# Patient Record
Sex: Male | Born: 1965 | Race: Black or African American | Hispanic: No | Marital: Married | Smoking: Never smoker
Health system: Southern US, Community
[De-identification: ages and names within clinical notes are randomized; demographics above are authoritative.]

## PROBLEM LIST (undated history)

## (undated) DIAGNOSIS — M109 Gout, unspecified: Secondary | ICD-10-CM

## (undated) DIAGNOSIS — M1990 Unspecified osteoarthritis, unspecified site: Secondary | ICD-10-CM

## (undated) DIAGNOSIS — E119 Type 2 diabetes mellitus without complications: Secondary | ICD-10-CM

## (undated) DIAGNOSIS — G4730 Sleep apnea, unspecified: Secondary | ICD-10-CM

## (undated) DIAGNOSIS — C61 Malignant neoplasm of prostate: Secondary | ICD-10-CM

## (undated) DIAGNOSIS — K219 Gastro-esophageal reflux disease without esophagitis: Secondary | ICD-10-CM

## (undated) DIAGNOSIS — Z973 Presence of spectacles and contact lenses: Secondary | ICD-10-CM

## (undated) DIAGNOSIS — J302 Other seasonal allergic rhinitis: Secondary | ICD-10-CM

## (undated) DIAGNOSIS — E7800 Pure hypercholesterolemia, unspecified: Secondary | ICD-10-CM

## (undated) HISTORY — DX: Pure hypercholesterolemia, unspecified: E78.00

## (undated) HISTORY — DX: Gout, unspecified: M10.9

## (undated) HISTORY — DX: Gastro-esophageal reflux disease without esophagitis: K21.9

## (undated) HISTORY — PX: PROSTATE BIOPSY: SHX241

## (undated) HISTORY — PX: CARPAL TUNNEL RELEASE: SHX101

## (undated) HISTORY — PX: COLONOSCOPY: SHX174

## (undated) HISTORY — DX: Sleep apnea, unspecified: G47.30

## (undated) HISTORY — DX: Unspecified osteoarthritis, unspecified site: M19.90

## (undated) HISTORY — PX: HIATAL HERNIA REPAIR: SHX195

---

## 2018-08-25 DIAGNOSIS — R059 Cough, unspecified: Secondary | ICD-10-CM | POA: Insufficient documentation

## 2018-08-25 DIAGNOSIS — J189 Pneumonia, unspecified organism: Secondary | ICD-10-CM | POA: Insufficient documentation

## 2018-08-25 DIAGNOSIS — R0981 Nasal congestion: Secondary | ICD-10-CM | POA: Insufficient documentation

## 2019-05-19 ENCOUNTER — Encounter: Payer: Self-pay | Admitting: Urology

## 2019-05-19 ENCOUNTER — Other Ambulatory Visit: Payer: Self-pay

## 2019-05-19 ENCOUNTER — Ambulatory Visit (INDEPENDENT_AMBULATORY_CARE_PROVIDER_SITE_OTHER): Payer: BC Managed Care – PPO | Admitting: Urology

## 2019-05-19 VITALS — BP 136/82 | HR 57 | Temp 97.9°F | Ht 74.0 in | Wt 280.0 lb

## 2019-05-19 DIAGNOSIS — R972 Elevated prostate specific antigen [PSA]: Secondary | ICD-10-CM | POA: Diagnosis not present

## 2019-05-19 DIAGNOSIS — N521 Erectile dysfunction due to diseases classified elsewhere: Secondary | ICD-10-CM | POA: Diagnosis not present

## 2019-05-19 LAB — PSA: PSA: 7.9 ng/mL — ABNORMAL HIGH (ref <=4.0)

## 2019-05-19 MED ORDER — SILDENAFIL CITRATE 100 MG PO TABS: 100.0000 mg | ORAL_TABLET | ORAL | 1 refills | Status: DC | PRN

## 2019-05-19 MED ORDER — SILDENAFIL CITRATE 100 MG PO TABS: 100.0000 mg | ORAL_TABLET | ORAL | 11 refills | Status: DC | PRN

## 2019-05-19 NOTE — Progress Notes (Signed)
H&P  Chief Complaint: Elevated PSA  History of Present Illness:   1.12.2021: Referred today by Dr Hardie Shackleton having had an elevated PSA -- on 11.2.2021 it measured as 8.2. He denies any significant urinary sx's or complaints, any family hx of prostate issues, any personal hx of prostate infection, and he states that out of all of his regular PSA checks this recent lab was the only abnormal result.   He also notes some recently worsening erectile dysfunction for which he has not sought any treatment. He states that he is having no issues achieving erection but has been having worsening issues maintaining one when needed. He was previously prescribed Viagra but was hesitant to have this filled due to his perception of this drug being dangerous for one's heart.  IPSS Questionnaire (AUA-7): Over the past month.   1)  How often have you had a sensation of not emptying your bladder completely after you finish urinating?  1 - Less than 1 time in 5  2)  How often have you had to urinate again less than two hours after you finished urinating? 2 - Less than half the time  3)  How often have you found you stopped and started again several times when you urinated?  0 - Not at all  4) How difficult have you found it to postpone urination?  0 - Not at all  5) How often have you had a weak urinary stream?  0 - Not at all  6) How often have you had to push or strain to begin urination?  0 - Not at all  7) How many times did you most typically get up to urinate from the time you went to bed until the time you got up in the morning?  2 - 2 times  Total score:  0-7 mildly symptomatic   8-19 moderately symptomatic   20-35 severely symptomatic    Past Medical History:  Diagnosis Date  . Acid reflux   . Arthritis   . Gout   . High cholesterol   . Sleep apnea     Home Medications:  Allergies as of 05/19/2019      Reactions   Mixed Ragweed    Watery eyes, sneezing      Medication List       Accurate  as of May 19, 2019  9:48 AM. If you have any questions, ask your nurse or doctor.        colchicine 0.6 MG tablet Take 0.6 mg by mouth daily.   ezetimibe 10 MG tablet Commonly known as: ZETIA Take 10 mg by mouth daily.   famotidine 20 MG tablet Commonly known as: PEPCID   fexofenadine 180 MG tablet Commonly known as: ALLEGRA Take 180 mg by mouth daily.   gabapentin 300 MG capsule Commonly known as: NEURONTIN Take 300 mg by mouth 3 (three) times daily as needed.   ibuprofen 800 MG tablet Commonly known as: ADVIL Take 800 mg by mouth 3 (three) times daily as needed.   omeprazole 40 MG capsule Commonly known as: PRILOSEC Take 40 mg by mouth daily.   Vitamin D3 50 MCG (2000 UT) Tabs Take 50 mcg by mouth.       Allergies:  Allergies  Allergen Reactions  . Mixed Ragweed     Watery eyes, sneezing    No family history on file.  Social History:  reports that he has never smoked. He has never used smokeless tobacco. He reports current alcohol  use. No history on file for drug.  ROS: A complete review of systems was performed.  All systems are negative except for pertinent findings as noted.  Physical Exam:  Vital signs in last 24 hours: BP 136/82   Pulse (!) 57   Temp 97.9 F (36.6 C)   Ht 6\' 2"  (1.88 m)   Wt 280 lb (127 kg)   BMI 35.95 kg/m   Constitutional:  Alert and oriented, No acute distress Cardiovascular: Regular rate  Respiratory: Normal respiratory effort GI: Abdomen is soft, nontender, nondistended, no abdominal masses. No CVAT.  Genitourinary: Normal male phallus, testes are descended bilaterally and non-tender and without masses, scrotum is normal in appearance without lesions or masses, perineum is normal on inspection. Lymphatic: No lymphadenopathy. Prostate feels around 30 grams in size, no nodules or firmness, DRE normal.  Neurologic: Grossly intact, no focal deficits Psychiatric: Normal mood and affect  Laboratory Data:   Dr Romilda Garret'  records reviewed  U/A reviewed     Renal Function: No results for input(s): CREATININE in the last 168 hours. CrCl cannot be calculated (No successful lab value found.).  Radiologic Imaging: No results found.  Impression/Assessment:  Start on trial of sildenafil -- he was assured that there is no reason to be concerned for his heart health just from taking this medication.   We will recheck his PSA today, it is possible that this elevation was due to some benign irritation process. This is more likely given that he has had no other abnormal measurements from any of his annual checks. Pending results, will plan appropriate follow-up. Prostate exam normal.   Plan:  1. Recheck PSA today -- will forward results.  2. Pending results, will plan appropriate follow-up. If still elevated, will consult on scheduling [place] bx. If normal, I will still have him return in a few mo's for recheck.   3. Start on sildenafil to use PRN. SE's discussed

## 2019-05-22 ENCOUNTER — Telehealth: Payer: Self-pay

## 2019-05-22 ENCOUNTER — Other Ambulatory Visit: Payer: Self-pay | Admitting: Urology

## 2019-05-22 DIAGNOSIS — R972 Elevated prostate specific antigen [PSA]: Secondary | ICD-10-CM

## 2019-05-22 MED ORDER — LEVOFLOXACIN 750 MG PO TABS: 750.0000 mg | ORAL_TABLET | Freq: Every day | ORAL | 0 refills | Status: AC

## 2019-05-22 NOTE — Telephone Encounter (Signed)
Patient notified of PSA value and Dr. Diona Fanti recommending prostate biopsy. Attempted to schedule with pt. Pt wishes to call office back to schedule with he calls his work. Informed pt importance of scheduling biopsy. Pt voiced understanding and he would call back.

## 2019-05-22 NOTE — Telephone Encounter (Signed)
-----   Message from Franchot Gallo, MD sent at 05/22/2019 12:11 PM EST ----- Notify pt psa still high--7.9 will need TRUS/Bx--orders put in

## 2019-05-26 ENCOUNTER — Other Ambulatory Visit: Payer: Self-pay

## 2019-05-26 DIAGNOSIS — R972 Elevated prostate specific antigen [PSA]: Secondary | ICD-10-CM

## 2019-06-30 ENCOUNTER — Other Ambulatory Visit: Payer: Self-pay

## 2019-06-30 ENCOUNTER — Ambulatory Visit (HOSPITAL_COMMUNITY)
Admission: RE | Admit: 2019-06-30 | Discharge: 2019-06-30 | Disposition: A | Discharge status: Home / Self Care | Payer: BC Managed Care – PPO | Source: Ambulatory Visit | Attending: Urology | Admitting: Urology

## 2019-06-30 ENCOUNTER — Other Ambulatory Visit: Payer: Self-pay | Admitting: Urology

## 2019-06-30 DIAGNOSIS — C61 Malignant neoplasm of prostate: Secondary | ICD-10-CM | POA: Insufficient documentation

## 2019-06-30 DIAGNOSIS — R972 Elevated prostate specific antigen [PSA]: Secondary | ICD-10-CM

## 2019-06-30 MED ORDER — LIDOCAINE HCL (PF) 2 % IJ SOLN
INTRAMUSCULAR | Status: AC
  Filled 2019-06-30: qty 10

## 2019-06-30 MED ORDER — GENTAMICIN SULFATE 40 MG/ML IJ SOLN: 160.0000 mg | Freq: Once | INTRAMUSCULAR | Status: AC

## 2019-06-30 MED ORDER — GENTAMICIN SULFATE 40 MG/ML IJ SOLN
INTRAMUSCULAR | Status: AC
  Administered 2019-06-30: 160 mg via INTRAMUSCULAR
  Filled 2019-06-30: qty 4

## 2019-06-30 NOTE — Procedures (Signed)
This man presents for TRUS/Bx.  Reason for procedure: Elevated PSA  Prostate exam: 30 gm, no nodules.  Appropriate timeout was performed. The patient was placed in the left lateral decubitus position.  Transrectal ultrasound probe was passed without difficulty. Prostate was scanned in transverse and sagittal dimensions.  Volume was obtained. Ultrasound findings: No hypoechoic regions. SVs nml. Volume 43 ml. Using a spinal needle, 10 mL of 2% plain lidocaine was utilized to infiltrate the lateral aspect of the seminal vesicles bilaterally. Following this, 12 cores were taken using the biopsy gun, and the following distribution: Right base lateral, right base medial, right mid lateral, right mid medial, right apex lateral, right apex medial, left base lateral, left base medial, left mid lateral, left mid medial, left apex lateral, left apex medial.  These were all sent for pathologic review separately in formalin.  At this point, the probe was removed.  Pressure was placed on the anal area for approximately 1 minute.  After establishing no significant bleeding, the procedure was then terminated.  The patient tolerated the procedure well. He was given post-boipsy instructions. We will call with pathology results and followup.

## 2019-07-10 ENCOUNTER — Telehealth: Payer: Self-pay

## 2019-07-10 NOTE — Telephone Encounter (Signed)
Schedule virtual visit with pt at 8:15 on April 6th. . Pt notified.

## 2019-07-10 NOTE — Telephone Encounter (Signed)
-----   Message from Franchot Gallo, MD sent at 07/09/2019 12:41 PM EST ----- Regarding: RE: appt yes ----- Message ----- From: Dorisann Frames, RN Sent: 07/08/2019   9:47 AM EST To: Franchot Gallo, MD Subject: RE: appt                                       I called pt to schedule. Pt works 3rd shift and doesn't  have Tuesday evenings off. On April 6th would you be willing to do a virtual cancer talk that am in your biopsy slot time? ----- Message ----- From: Franchot Gallo, MD Sent: 07/07/2019   4:59 PM EST To: Dorisann Frames, RN Subject: appt                                           Please have bx results sent to pt and sched PCA consult ----- Message ----- From: Dorisann Frames, RN Sent: 07/01/2019  12:07 PM EST To: Franchot Gallo, MD  Biopsy path report

## 2019-07-28 ENCOUNTER — Other Ambulatory Visit: Payer: Self-pay | Admitting: Urology

## 2019-07-28 DIAGNOSIS — R972 Elevated prostate specific antigen [PSA]: Secondary | ICD-10-CM

## 2019-07-28 MED ORDER — LEVOFLOXACIN 750 MG PO TABS: 750.0000 mg | ORAL_TABLET | Freq: Every day | ORAL | 0 refills | Status: AC

## 2019-07-29 ENCOUNTER — Other Ambulatory Visit: Payer: Self-pay

## 2019-08-11 ENCOUNTER — Other Ambulatory Visit: Payer: Self-pay

## 2019-08-11 ENCOUNTER — Telehealth (INDEPENDENT_AMBULATORY_CARE_PROVIDER_SITE_OTHER): Payer: BC Managed Care – PPO | Admitting: Urology

## 2019-08-11 DIAGNOSIS — C61 Malignant neoplasm of prostate: Secondary | ICD-10-CM

## 2019-08-11 DIAGNOSIS — Z7189 Other specified counseling: Secondary | ICD-10-CM | POA: Diagnosis not present

## 2019-08-11 DIAGNOSIS — Z712 Person consulting for explanation of examination or test findings: Secondary | ICD-10-CM

## 2019-08-11 NOTE — Progress Notes (Signed)
This man underwent ultrasound and biopsy of his prostate on 2.23.2021.  At that time, PSA was 7.9.  Prostate volume 43 mL.  PSAD 0.18.  4/12 cores revealed adenocarcinoma: 3 cores revealed GS 3+3 pattern-left base lateral, left base medial and left mid medial with 90%, 20% and 3% of core involved with cancer. 1 core revealed GS 3+4 pattern,.1 was located in the left apex lateral zone, 60% of core involved with cancer.  IPSS 5  Kattan nomogram predictions: Organ confined disease--67% Lymph node/seminal vesicle involvement--3% each Progression free probability following radical prostatectomy at 5/10 years--84/74% respectively  He presents for virtual/telehealth visit today.    The patient was counseled about the natural history of prostate cancer and the standard treatment options that are available for prostate cancer. It was explained to him how his age and life expectancy, clinical stage, Gleason score, and PSA affect his prognosis, the decision to proceed with additional staging studies, as well as how that information influences recommended treatment strategies. We discussed the roles for active surveillance, radiation therapy, surgical therapy, androgen deprivation, as well as ablative therapy options for the treatment of prostate cancer as appropriate to his individual cancer situation. We discussed the risks and benefits of these options with regard to their impact on cancer control and also in terms of potential adverse events, complications, and impact on quality of life particularly related to urinary, bowel, and sexual function. The patient was encouraged to ask questions throughout the discussion today and all questions were answered to his stated satisfaction. In addition, the patient was provided with and/or directed to appropriate resources and literature for further education about prostate cancer and treatment options  The patient seems to prefer brachytherapy over radical  prostatectomy.  I think he is a good candidate with having a normal size gland, minimal urinary symptomatology and relatively low risk prostate cancer.  I will set up an appointment to have the patient see Dr. Tyler Pita at the regional [facility] for consultation.  27 minutes spent in direct communication over the phone during this consultation.

## 2019-08-25 ENCOUNTER — Telehealth: Payer: BC Managed Care – PPO | Admitting: Urology

## 2019-08-31 ENCOUNTER — Encounter: Payer: Self-pay | Admitting: Radiation Oncology

## 2019-08-31 NOTE — Progress Notes (Signed)
GU Location of Tumor / Histology: prostatic adenocarcinoma  If Prostate Cancer, Gleason Score is (3 + 4) and PSA is (7.9) on 05-22-2019. Prostate volume: 43 mL.  Marc Kim was referred by Dr. Hardie Shackleton to Dr. Diona Fanti after discovering an elevated PSA of 8.2 on 03/29/2019.  Biopsies of prostate (if applicable) revealed:    Past/Anticipated interventions by urology, if any: prostate biopsy, referral to Dr. Tammi Klippel to discuss brachtherapy  Past/Anticipated interventions by medical oncology, if any: no  Weight changes, if any: no  Bowel/Bladder complaints, if any: IPSS 1. SHIM 20. Prescribed sildenafil. Nocturia x 1. Denies dysuria or hematuria. Denies blood in semen. Denies urinary leakage or incontinence. Denies any bowel complaints.    Nausea/Vomiting, if any: no  Pain issues, if any:  denies  SAFETY ISSUES:  Prior radiation? denies  Pacemaker/ICD? denies  Possible current pregnancy? no, male patient  Is the patient on methotrexate? denies  Current Complaints / other details:  54 year old male. Married with 1 child.

## 2019-09-01 ENCOUNTER — Ambulatory Visit
Admission: RE | Admit: 2019-09-01 | Discharge: 2019-09-01 | Disposition: A | Discharge status: Home / Self Care | Payer: BC Managed Care – PPO | Source: Ambulatory Visit | Attending: Radiation Oncology | Admitting: Radiation Oncology

## 2019-09-01 ENCOUNTER — Encounter: Payer: Self-pay | Admitting: Radiation Oncology

## 2019-09-01 ENCOUNTER — Other Ambulatory Visit: Payer: Self-pay

## 2019-09-01 VITALS — Ht 75.0 in | Wt 285.0 lb

## 2019-09-01 DIAGNOSIS — C61 Malignant neoplasm of prostate: Secondary | ICD-10-CM

## 2019-09-01 HISTORY — DX: Malignant neoplasm of prostate: C61

## 2019-09-01 NOTE — Progress Notes (Signed)
Radiation Oncology         [phone] ________________________________  Initial Outpatient Consultation - Conducted via Telephone due to current COVID-19 concerns for limiting patient exposure  Name: Marc Kim MRN: [id number]  Date: 09/01/2019  DOB: May 06, 1966  [id number], Marc Dienes, MD  Franchot Gallo, MD   REFERRING PHYSICIAN: Franchot Gallo, MD  DIAGNOSIS: 54 y.o. gentleman with Stage T1c adenocarcinoma of the prostate with Gleason score of 3+4, and PSA of 7.9.    ICD-10-CM   1. Malignant neoplasm of prostate ([place])  C61     HISTORY OF PRESENT ILLNESS: Marc Kim is a 54 y.o. male with a diagnosis of prostate cancer. He was noted to have an elevated PSA of 8.2 in 03/2019 by his primary care physician, Dr. Hampton Abbot.  Accordingly, he was referred for evaluation in urology by Dr. Diona Fanti on 05/19/2019,  digital rectal examination was performed at that time revealing no nodules.  Repeat PSA performed that day remained elevated at 7.9.  The patient proceeded to transrectal ultrasound with 12 biopsies of the prostate on 06/30/2019.  The prostate volume measured 43 cc.  Out of 12 core biopsies, 4 were positive, all on the left.  The maximum Gleason score was 3+4, and this was seen in the left apex lateral (with PNI). Additionally, Gleason 3+3 was seen in the left base lateral, left base (two small foci), and left mid (small focus).  The patient reviewed the biopsy results with his urologist and he has kindly been referred today for discussion of potential radiation treatment options.   PREVIOUS RADIATION THERAPY: No  PAST MEDICAL HISTORY:  Past Medical History:  Diagnosis Date  . Acid reflux   . Arthritis   . Gout   . High cholesterol   . Prostate cancer ([place])   . Sleep apnea       PAST SURGICAL HISTORY: Past Surgical History:  Procedure Laterality Date  . HIATAL HERNIA REPAIR  2002?  . PROSTATE BIOPSY      FAMILY HISTORY:  Family History  Problem Relation Age of  Onset  . Breast cancer Neg Hx   . Prostate cancer Neg Hx   . Colon cancer Neg Hx   . 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 Social History   Socioeconomic History  . Marital status: Married    Spouse name: Missy  . Number of children: 1  . Years of education: Not on file  . Highest education level: Not on file  Occupational History  . Occupation: truck Geophysicist/field seismologist    Comment: Full time   Tobacco Use  . Smoking status: Never Smoker  . Smokeless tobacco: Never Used  Substance and Sexual Activity  . Alcohol use: Yes    Comment: 2 per week  . Drug use: Never  . Sexual activity: Yes  Other Topics Concern  . Not on file  Social History Narrative  . Not on file   Social Determinants of Health   Financial Resource Strain:   . Difficulty of Paying Living Expenses:   Food Insecurity:   . Worried About Charity fundraiser in the Last Year:   . Arboriculturist in the Last Year:   Transportation Needs:   . Film/video editor (Medical):   Marland Kitchen Lack of Transportation (Non-Medical):   Physical Activity:   . Days of Exercise per Week:   . Minutes of Exercise  per Session:   Stress:   . Feeling of Stress :   Social Connections:   . Frequency of Communication with Friends and Family:   . Frequency of Social Gatherings with Friends and Family:   . Attends Religious Services:   . Active Member of Clubs or Organizations:   . Attends Archivist Meetings:   Marland Kitchen Marital Status:   Intimate Partner Violence:   . Fear of Current or Ex-Partner:   . Emotionally Abused:   Marland Kitchen Physically Abused:   . Sexually Abused:     ALLERGIES: Mixed ragweed  MEDICATIONS:  Current Outpatient Medications  Medication Sig Dispense Refill  . Cholecalciferol (VITAMIN D3) 50 MCG (2000 UT) TABS Take 50 mcg by mouth.    . fexofenadine (ALLEGRA) 180 MG tablet Take 180 mg by mouth daily.    Marland Kitchen gabapentin (NEURONTIN) 300  MG capsule Take 300 mg by mouth 3 (three) times daily as needed.    Marland Kitchen ibuprofen (ADVIL) 800 MG tablet Take 800 mg by mouth 3 (three) times daily as needed.    Marland Kitchen omeprazole (PRILOSEC) 40 MG capsule Take 40 mg by mouth daily.    . TRULICITY 1.5 0000000 SOPN Inject 1.5 mg into the skin once a week.    . colchicine 0.6 MG tablet Take 0.6 mg by mouth daily.    . sildenafil (VIAGRA) 100 MG tablet Take 1 tablet (100 mg total) by mouth as needed for erectile dysfunction. (Patient not taking: Reported on 09/01/2019) 20 tablet 11   No current facility-administered medications for this encounter.    REVIEW OF SYSTEMS:  On review of systems, the patient reports that he is doing well overall. He denies any chest pain, shortness of breath, cough, fevers, chills, night sweats, unintended weight changes. He denies any bowel disturbances, and denies abdominal pain, nausea or vomiting. He denies any new musculoskeletal or joint aches or pains. His IPSS was 1, indicating minimal urinary symptoms. His SHIM was 20, indicating he may have some mild erectile dysfunction. A complete review of systems is obtained and is otherwise negative.    PHYSICAL EXAM:  Wt Readings from Last 3 Encounters:  09/01/19 285 lb (129.3 kg)  05/19/19 280 lb (127 kg)   Temp Readings from Last 3 Encounters:  06/30/19 97.8 F (36.6 C) (Oral)  05/19/19 97.9 F (36.6 C)   BP Readings from Last 3 Encounters:  06/30/19 140/88  05/19/19 136/82   Pulse Readings from Last 3 Encounters:  06/30/19 64  05/19/19 (!) 57   Pain Assessment Pain Score: 0-No pain/10  Physical exam not performed in light of telephone consult visit format.   KPS = 100  100 - Normal; no complaints; no evidence of disease. 90   - Able to carry on normal activity; minor signs or symptoms of disease. 80   - Normal activity with effort; some signs or symptoms of disease. 56   - Cares for self; unable to carry on normal activity or to do active work. 60   -  Requires occasional assistance, but is able to care for most of his personal needs. 50   - Requires considerable assistance and frequent medical care. 28   - Disabled; requires special care and assistance. 45   - Severely disabled; hospital admission is indicated although death not imminent. 6   - Very sick; hospital admission necessary; active supportive treatment necessary. 10   - Moribund; fatal processes progressing rapidly. 0     - Dead  Karnofsky DA,  Abelmann WH, Craver LS and Burchenal JH [phone]) The use of the nitrogen mustards in the palliative treatment of carcinoma: with particular reference to bronchogenic carcinoma Cancer 1 634-56  LABORATORY DATA:  No results found for: WBC, HGB, HCT, MCV, PLT No results found for: NA, K, CL, CO2 No results found for: ALT, AST, GGT, ALKPHOS, BILITOT   RADIOGRAPHY: No results found.    IMPRESSION/PLAN: This visit was conducted via Telephone to spare the patient unnecessary potential exposure in the healthcare setting during the current COVID-19 pandemic. 1. 54 y.o. gentleman with Stage T1c adenocarcinoma of the prostate with Gleason Score of 3+4, and PSA of 7.9. We discussed the patient's workup and outlined the nature of prostate cancer in this setting. The patient's T stage, Gleason's score, and PSA put him into the favorable intermediate risk group. Accordingly, he is eligible for a variety of potential treatment options including brachytherapy, 5.5 weeks of external radiation, or prostatectomy. We discussed the available radiation techniques, and focused on the details and logistics of delivery. We discussed and outlined the risks, benefits, short and long-term effects associated with radiotherapy and compared and contrasted these with prostatectomy. We discussed the role of SpaceOAR in reducing the rectal toxicity associated with radiotherapy. The patient is adamantly not interested in prostatectomy.  At the end of the conversation, the patient  is interested in moving forward with brachytherapy and use of SpaceOAR to reduce rectal toxicity from radiotherapy.  We will share our discussion with Dr. Diona Fanti and move forward with scheduling his [facility] planning appointment in the near future.  The patient will be contacted by Marc Kim in our office who will be working closely with him to coordinate OR scheduling and pre and post procedure appointments.  We will contact the pharmaceutical rep to ensure that [place] is available at the time of procedure.  He will have a prostate MRI following his post-seed CT SIM to confirm appropriate distribution of the [place].   Given current concerns for patient exposure during the COVID-19 pandemic, this encounter was conducted via telephone. The patient was notified in advance and was offered a MyChart meeting to allow for face to face communication but unfortunately reported that he did not have the appropriate resources/technology to support such a visit and instead preferred to proceed with telephone consult. The patient has given verbal consent for this type of encounter. The time spent during this encounter was 60 minutes. The attendants for this meeting include Tyler Pita MD, Ashlyn Bruning PA-C, [place], and patient, Marc Kim. During the encounter, Tyler Pita MD, Ashlyn Bruning PA-C, and scribe, Wilburn Mylar were located at [place].  Patient, Marc Kim was located at home.    Nicholos Johns, PA-C    Tyler Pita, MD  [facility] Direct Dial: [phone]  Fax: [phone] [email or web address]  Skype  [email or web address]  This document serves as a record of services personally performed by Tyler Pita, MD and Freeman Caldron, PA-C. It was created on their behalf by Wilburn Mylar, a trained medical scribe. The creation of this record is based on the scribe's personal  observations and the provider's statements to them. This document has been checked and approved by the attending provider.

## 2019-09-01 NOTE — Progress Notes (Signed)
See progress note under physician encounter. 

## 2019-09-08 ENCOUNTER — Encounter: Payer: Self-pay | Admitting: Medical Oncology

## 2019-09-08 NOTE — Progress Notes (Signed)
Spoke with patient to introduce myself as the prostate nurse navigator and discuss my role. I was unable to meet him 4/27, when he consulted with Dr. Tammi Klippel. He states the consult went well and he has chosen brachytherapy. He asked if I called him yesterday about an appointment. 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 I informed him that it possibly could be Enid Derry, calling to set up CT simulation. He asked about taking a few weeks off for the procedure. I discussed getting FMLA forms that our office can complete. I gave him my contact information and asked him to call me with questions or concerns. I will ask Enid Derry to contact him, if she was trying to reach him.

## 2019-09-15 ENCOUNTER — Telehealth: Payer: Self-pay | Admitting: *Deleted

## 2019-09-15 ENCOUNTER — Telehealth: Payer: Self-pay

## 2019-09-15 ENCOUNTER — Other Ambulatory Visit: Payer: Self-pay | Admitting: Urology

## 2019-09-15 NOTE — Telephone Encounter (Signed)
Left message with pre op appt date on pt voicemail.

## 2019-09-15 NOTE — Telephone Encounter (Signed)
Called patient to inform of implant date, spoke with patient and he is aware of this implant date

## 2019-09-15 NOTE — Telephone Encounter (Signed)
-----   Message from Franchot Gallo, MD sent at 09/15/2019 12:38 PM EDT ----- Regarding: seeds patient scheduled for brachytherapy in [place] on July 8.  He needs preop appointment here less than a month beforehand.

## 2019-09-22 ENCOUNTER — Telehealth: Payer: Self-pay | Admitting: *Deleted

## 2019-09-22 NOTE — Telephone Encounter (Signed)
CALLED PATIENT TO ALTER 10/02/19 APPTS. DUE TO ASHLYN BRUNING BEING OFF, RESCHEDULED FOR 10-09-19, PATIENT AGREED TO NEW DATE AND TIME

## 2019-10-02 ENCOUNTER — Encounter (HOSPITAL_COMMUNITY): Payer: BC Managed Care – PPO

## 2019-10-02 ENCOUNTER — Ambulatory Visit: Payer: BC Managed Care – PPO | Admitting: Radiation Oncology

## 2019-10-02 ENCOUNTER — Ambulatory Visit: Payer: Self-pay | Admitting: Urology

## 2019-10-08 ENCOUNTER — Telehealth: Payer: Self-pay

## 2019-10-08 NOTE — Telephone Encounter (Signed)
Appointment reminder for 10/09/19 patient verbalized he understood its an in person appointment.

## 2019-10-09 ENCOUNTER — Ambulatory Visit (HOSPITAL_COMMUNITY)
Admission: RE | Admit: 2019-10-09 | Discharge: 2019-10-09 | Disposition: A | Discharge status: Home / Self Care | Payer: BC Managed Care – PPO | Source: Ambulatory Visit | Attending: Urology | Admitting: Urology

## 2019-10-09 ENCOUNTER — Ambulatory Visit
Admission: RE | Admit: 2019-10-09 | Discharge: 2019-10-09 | Disposition: A | Discharge status: Home / Self Care | Payer: BC Managed Care – PPO | Source: Ambulatory Visit | Attending: Radiation Oncology | Admitting: Radiation Oncology

## 2019-10-09 ENCOUNTER — Encounter: Payer: Self-pay | Admitting: Medical Oncology

## 2019-10-09 ENCOUNTER — Other Ambulatory Visit: Payer: Self-pay | Admitting: Urology

## 2019-10-09 ENCOUNTER — Other Ambulatory Visit: Payer: Self-pay

## 2019-10-09 ENCOUNTER — Encounter (HOSPITAL_COMMUNITY)
Admission: RE | Admit: 2019-10-09 | Discharge: 2019-10-09 | Disposition: A | Discharge status: Home / Self Care | Payer: BC Managed Care – PPO | Source: Ambulatory Visit | Attending: Urology | Admitting: Urology

## 2019-10-09 ENCOUNTER — Encounter: Payer: Self-pay | Admitting: Radiation Oncology

## 2019-10-09 ENCOUNTER — Ambulatory Visit
Admission: RE | Admit: 2019-10-09 | Discharge: 2019-10-09 | Disposition: A | Discharge status: Home / Self Care | Payer: BC Managed Care – PPO | Source: Ambulatory Visit | Attending: Urology | Admitting: Urology

## 2019-10-09 DIAGNOSIS — Z01818 Encounter for other preprocedural examination: Secondary | ICD-10-CM

## 2019-10-09 DIAGNOSIS — C61 Malignant neoplasm of prostate: Secondary | ICD-10-CM

## 2019-10-12 ENCOUNTER — Encounter: Payer: Self-pay | Admitting: Radiation Oncology

## 2019-10-12 NOTE — Progress Notes (Signed)
Delivered FMLA paperwork to orange folder in front office of radiation oncology per protocol.

## 2019-10-13 NOTE — Progress Notes (Signed)
  Radiation Oncology         [phone] ________________________________  Name: Marc Kim MRN: [id number]  Date: 10/09/2019  DOB: 1966/04/22  SIMULATION AND TREATMENT PLANNING NOTE PUBIC ARCH STUDY  TX:[facility], Barry Dienes, MD  Franchot Gallo, MD  DIAGNOSIS: 54 y.o. gentleman with Stage T1c adenocarcinoma of the prostate with Gleason score of 3+4, and PSA of 7.9  Oncology History  Malignant neoplasm of prostate ([place])  07/03/2019 Cancer Staging   Staging form: Prostate, AJCC 8th Edition - Clinical stage from 07/03/2019: Stage IIB (cT1c, cN0, cM0, PSA: 7.9, Grade Group: 2) - Signed by Freeman Caldron, PA-C on 09/01/2019   09/01/2019 Initial Diagnosis   Malignant neoplasm of prostate ([place])       ICD-10-CM   1. Malignant neoplasm of prostate ([place])  C61     COMPLEX SIMULATION:  The patient presented today for evaluation for possible prostate seed implant. He was brought to the radiation planning suite and placed supine on the CT couch. A 3-dimensional image study set was obtained in upload to the planning computer. There, on each axial slice, I contoured the prostate gland. Then, using three-dimensional radiation planning tools I reconstructed the prostate in view of the structures from the transperineal needle pathway to assess for possible pubic arch interference. In doing so, I did not appreciate any pubic arch interference. Also, the patient's prostate volume was estimated based on the drawn structure. The volume was 43 cc.  Given the pubic arch appearance and prostate volume, patient remains a good candidate to proceed with prostate seed implant. Today, he freely provided informed written consent to proceed.    PLAN: The patient will undergo prostate seed implant.   ________________________________  Sheral Apley. Tammi Klippel, M.D.

## 2019-11-03 ENCOUNTER — Other Ambulatory Visit: Payer: Self-pay

## 2019-11-03 ENCOUNTER — Encounter: Payer: Self-pay | Admitting: Urology

## 2019-11-03 ENCOUNTER — Ambulatory Visit: Payer: BC Managed Care – PPO | Admitting: Urology

## 2019-11-03 ENCOUNTER — Ambulatory Visit (INDEPENDENT_AMBULATORY_CARE_PROVIDER_SITE_OTHER): Payer: BC Managed Care – PPO | Admitting: Urology

## 2019-11-03 VITALS — BP 122/79 | HR 64 | Temp 97.9°F | Ht 75.0 in | Wt 280.0 lb

## 2019-11-03 DIAGNOSIS — C61 Malignant neoplasm of prostate: Secondary | ICD-10-CM | POA: Diagnosis not present

## 2019-11-03 LAB — POCT URINALYSIS DIPSTICK
Bilirubin, UA: NEGATIVE
Blood, UA: NEGATIVE
Glucose, UA: NEGATIVE
Ketones, UA: NEGATIVE
Leukocytes, UA: NEGATIVE
Nitrite, UA: NEGATIVE
Protein, UA: NEGATIVE
Spec Grav, UA: >=1.03 — AB (ref 1.010–1.025)
Urobilinogen, UA: 0.2 E.U./dL
pH, UA: 5 (ref 5.0–8.0)

## 2019-11-03 NOTE — Progress Notes (Signed)
See progress note.

## 2019-11-03 NOTE — Progress Notes (Signed)
H&P  Chief Complaint: Prostate cancer, preop visit  History of Present Illness: 54 year old male presents for preoperative visit. He is scheduled for brachytherapy in the near future. His history is as follows:  This man underwent ultrasound and biopsy of his prostate on 2.23.2021.  At that time, PSA was 7.9.  Prostate volume 43 mL.  PSAD 0.18.  4/12 cores revealed adenocarcinoma: 3 cores revealed GS 3+3 pattern-left base lateral, left base medial and left mid medial with 90%, 20% and 3% of core involved with cancer. 1 core revealed GS 3+4 pattern,.1 was located in the left apex lateral zone, 60% of core involved with cancer.  IPSS 5  Past Medical History:  Diagnosis Date  . Acid reflux   . Arthritis   . Gout   . High cholesterol   . Prostate cancer ([place])   . Sleep apnea     Past Surgical History:  Procedure Laterality Date  . HIATAL HERNIA REPAIR  2002?  . PROSTATE BIOPSY      Home Medications:  Allergies as of 11/03/2019      Reactions   Mixed Ragweed    Watery eyes, sneezing      Medication List       Accurate as of November 03, 2019  8:58 AM. If you have any questions, ask your nurse or doctor.        colchicine 0.6 MG tablet Take 0.6 mg by mouth daily.   D3-50 1.25 MG (50000 UT) capsule Generic drug: Cholecalciferol Take 50,000 Units by mouth once a week. What changed: Another medication with the same name was removed. Continue taking this medication, and follow the directions you see here. Changed by: Jorja Loa, MD   fexofenadine 180 MG tablet Commonly known as: ALLEGRA Take 180 mg by mouth daily.   gabapentin 300 MG capsule Commonly known as: NEURONTIN Take 300 mg by mouth 3 (three) times daily as needed.   ibuprofen 800 MG tablet Commonly known as: ADVIL Take 800 mg by mouth 3 (three) times daily as needed.   omeprazole 40 MG capsule Commonly known as: PRILOSEC Take 40 mg by mouth daily.   sildenafil 100 MG tablet Commonly known as:  Viagra Take 1 tablet (100 mg total) by mouth as needed for erectile dysfunction.   triamcinolone cream 0.1 % Commonly known as: KENALOG SMARTSIG:1 Application Topical 2-3 Times Daily   Trulicity 1.5 RX/5.4MG Sopn Generic drug: Dulaglutide Inject 1.5 mg into the skin once a week.       Allergies:  Allergies  Allergen Reactions  . Mixed Ragweed     Watery eyes, sneezing    Family History  Problem Relation Age of Onset  . Breast cancer Neg Hx   . Prostate cancer Neg Hx   . Colon cancer Neg Hx   . Pancreatic cancer Neg Hx     Social History:  reports that he has never smoked. He has never used smokeless tobacco. He reports current alcohol use. He reports that he does not use drugs.  ROS: Urological Symptom Review  Patient is experiencing the following symptoms: Get up at night to urinate Review of Systems Gastrointestinal (upper)  : Negative for upper GI symptoms Gastrointestinal (lower) : Negative for lower GI symptoms Constitutional : Negative for symptoms Skin: Negative for skin symptoms Eyes: Negative for eye symptoms Ear/Nose/Throat : Negative for Ear/Nose/Throat symptoms Hematologic/Lymphatic: Negative for Hematologic/Lymphatic symptoms Cardiovascular : Negative for cardiovascular symptoms Respiratory : Negative for respiratory symptoms Endocrine: Negative for endocrine symptoms Musculoskeletal:  Negative for musculoskeletal symptoms Neurological: Negative for neurological symptoms Psychologic: Negative for psychiatric symptoms   Physical Exam:  Vital signs in last 24 hours: BP 122/79   Pulse 64   Temp 97.9 F (36.6 C)   Ht 6\' 3"  (1.905 m)   Wt 280 lb (127 kg)   BMI 35.00 kg/m  Constitutional:  Alert and oriented, No acute distress Cardiovascular: Regular rate  Respiratory: Normal respiratory effort Neurologic: Grossly intact, no focal deficits Psychiatric: Normal mood and affect  I have reviewed prior pt notes  I have reviewed notes  from referring/previous physicians  I have reviewed urinalysis results  I have independently reviewed prior imaging  I have reviewed prior PSA results  Impression/Assessment:  Relatively low risk prostate cancer  Plan:  He is scheduled for I-125 brachytherapy  I described brief risks and complications as well as postoperative course with the patient.

## 2019-11-04 ENCOUNTER — Telehealth: Payer: Self-pay

## 2019-11-04 NOTE — Telephone Encounter (Signed)
[employer] paperwork completed. Wife called- per wife requested paperwork be mailed to pt house for pt to sign and send in.  Paperwork mailed.

## 2019-11-05 ENCOUNTER — Encounter (HOSPITAL_BASED_OUTPATIENT_CLINIC_OR_DEPARTMENT_OTHER): Payer: Self-pay | Admitting: Urology

## 2019-11-05 ENCOUNTER — Other Ambulatory Visit: Payer: Self-pay

## 2019-11-05 NOTE — Progress Notes (Signed)
Spoke with: Marc Kim NPO:  No food after midnight/Clear liquids until 7:00 AM DOS Arrival time: 8:00 AM Lab needs dos----   N/A           Lab results------CBC, CMP, PT, PTT, EKG, CXR COVID test ------ 11/10/2019                   Medications to take morning of surgery:Fexofenadine, Omeprazole Pre op orders in epic: Yes Diabetic medication -----None Patient Special Instructions -----Bring CPAP machine, mask and tubing Pre-Op special Istructions -----Fleet enema day of surgery Ride home:Missy (wife) [phone]  Patient verbalized understanding of instructions that were given at this phone interview. Patient denies shortness of breath, chest pain, fever, cough a this phone interview.

## 2019-11-05 NOTE — Progress Notes (Signed)
NEW Covid Policy July 5638  Surgery Day:  Thursday, 11/12/2019  Facility:  [facility]  Type of Surgery:  Radiactive seed implant  Fully Covid Vaccinated:   1) 10/10/2019                                          2) 10/31/2019                                          Where? [facility] [place] ,[place]                                           Type? Pfizer Not Covid Vaccinated:  N/A  Do you have symptoms? NO  In the past 14 days:        Have you had any symptoms?NO       Have you been tested covid positive?NO       Have you been in contact with someone covid positive?NO        Is pt Immuno-compromised?NO

## 2019-11-06 ENCOUNTER — Telehealth: Payer: Self-pay | Admitting: *Deleted

## 2019-11-06 NOTE — Telephone Encounter (Signed)
CALLED PATIENT TO REMIND OF LAB FOR 11-10-19, SPOKE WITH PATIENT AND HE IS AWARE OF THIS APPT.

## 2019-11-10 ENCOUNTER — Other Ambulatory Visit: Payer: Self-pay

## 2019-11-10 ENCOUNTER — Other Ambulatory Visit (HOSPITAL_COMMUNITY)
Admission: RE | Admit: 2019-11-10 | Discharge: 2019-11-10 | Disposition: A | Discharge status: Home / Self Care | Payer: BC Managed Care – PPO | Source: Ambulatory Visit | Attending: Urology | Admitting: Urology

## 2019-11-10 ENCOUNTER — Other Ambulatory Visit (HOSPITAL_COMMUNITY): Payer: BC Managed Care – PPO

## 2019-11-10 ENCOUNTER — Encounter (HOSPITAL_COMMUNITY)
Admission: RE | Admit: 2019-11-10 | Discharge: 2019-11-10 | Disposition: A | Discharge status: Home / Self Care | Payer: BC Managed Care – PPO | Source: Ambulatory Visit | Attending: Urology | Admitting: Urology

## 2019-11-10 DIAGNOSIS — G4730 Sleep apnea, unspecified: Secondary | ICD-10-CM | POA: Diagnosis not present

## 2019-11-10 DIAGNOSIS — E119 Type 2 diabetes mellitus without complications: Secondary | ICD-10-CM | POA: Diagnosis not present

## 2019-11-10 DIAGNOSIS — Z01812 Encounter for preprocedural laboratory examination: Secondary | ICD-10-CM | POA: Diagnosis not present

## 2019-11-10 DIAGNOSIS — C61 Malignant neoplasm of prostate: Secondary | ICD-10-CM | POA: Diagnosis present

## 2019-11-10 DIAGNOSIS — M1990 Unspecified osteoarthritis, unspecified site: Secondary | ICD-10-CM | POA: Diagnosis not present

## 2019-11-10 DIAGNOSIS — Z9109 Other allergy status, other than to drugs and biological substances: Secondary | ICD-10-CM | POA: Diagnosis not present

## 2019-11-10 DIAGNOSIS — Z20822 Contact with and (suspected) exposure to covid-19: Secondary | ICD-10-CM | POA: Diagnosis not present

## 2019-11-10 DIAGNOSIS — K219 Gastro-esophageal reflux disease without esophagitis: Secondary | ICD-10-CM | POA: Diagnosis not present

## 2019-11-10 DIAGNOSIS — E7800 Pure hypercholesterolemia, unspecified: Secondary | ICD-10-CM | POA: Diagnosis not present

## 2019-11-10 LAB — CBC
HCT: 44.2 % (ref 39.0–52.0)
Hemoglobin: 13.4 g/dL (ref 13.0–17.0)
MCH: 26 pg (ref 26.0–34.0)
MCHC: 30.3 g/dL (ref 30.0–36.0)
MCV: 85.7 fL (ref 80.0–100.0)
Platelets: 211 10*3/uL (ref 150–400)
RBC: 5.16 MIL/uL (ref 4.22–5.81)
RDW: 14 % (ref 11.5–15.5)
WBC: 4.8 10*3/uL (ref 4.0–10.5)
nRBC: 0 % (ref 0.0–0.2)

## 2019-11-10 LAB — COMPREHENSIVE METABOLIC PANEL
ALT: 21 U/L (ref 0–44)
AST: 17 U/L (ref 15–41)
Albumin: 4.5 g/dL (ref 3.5–5.0)
Alkaline Phosphatase: 83 U/L (ref 38–126)
Anion gap: 10 (ref 5–15)
BUN: 20 mg/dL (ref 6–20)
CO2: 23 mmol/L (ref 22–32)
Calcium: 9.3 mg/dL (ref 8.9–10.3)
Chloride: 107 mmol/L (ref 98–111)
Creatinine, Ser: 1.77 mg/dL — ABNORMAL HIGH (ref 0.61–1.24)
GFR calc Af Amer: 50 mL/min — ABNORMAL LOW (ref >60)
GFR calc non Af Amer: 43 mL/min — ABNORMAL LOW (ref >60)
Glucose, Bld: 155 mg/dL — ABNORMAL HIGH (ref 70–99)
Potassium: 3.9 mmol/L (ref 3.5–5.1)
Sodium: 140 mmol/L (ref 135–145)
Total Bilirubin: 1.1 mg/dL (ref 0.3–1.2)
Total Protein: 7.4 g/dL (ref 6.5–8.1)

## 2019-11-10 LAB — PROTIME-INR
INR: 1.1 (ref 0.8–1.2)
Prothrombin Time: 13.4 seconds (ref 11.4–15.2)

## 2019-11-10 LAB — SARS CORONAVIRUS 2 (TAT 6-24 HRS): SARS Coronavirus 2: NEGATIVE

## 2019-11-10 LAB — APTT: aPTT: 22 seconds — ABNORMAL LOW (ref 24–36)

## 2019-11-11 ENCOUNTER — Telehealth: Payer: Self-pay | Admitting: *Deleted

## 2019-11-11 NOTE — Telephone Encounter (Signed)
CALLED PATIENT TO REMIND OF PROCEDURE FOR 11-12-19, SPOKE WITH PATIENT AND HE IS AWARE OF THIS PROCEDURE

## 2019-11-12 ENCOUNTER — Encounter (HOSPITAL_BASED_OUTPATIENT_CLINIC_OR_DEPARTMENT_OTHER): Payer: Self-pay | Admitting: Urology

## 2019-11-12 ENCOUNTER — Ambulatory Visit (HOSPITAL_BASED_OUTPATIENT_CLINIC_OR_DEPARTMENT_OTHER)
Admission: RE | Admit: 2019-11-12 | Discharge: 2019-11-12 | Disposition: A | Discharge status: Home / Self Care | Payer: BC Managed Care – PPO | Attending: Urology | Admitting: Urology

## 2019-11-12 ENCOUNTER — Ambulatory Visit (HOSPITAL_BASED_OUTPATIENT_CLINIC_OR_DEPARTMENT_OTHER): Payer: BC Managed Care – PPO | Admitting: Anesthesiology

## 2019-11-12 ENCOUNTER — Ambulatory Visit (HOSPITAL_COMMUNITY): Payer: BC Managed Care – PPO

## 2019-11-12 ENCOUNTER — Encounter (HOSPITAL_BASED_OUTPATIENT_CLINIC_OR_DEPARTMENT_OTHER)
Admission: RE | Disposition: A | Discharge status: Home / Self Care | Payer: Self-pay | Source: Home / Self Care | Attending: Urology

## 2019-11-12 ENCOUNTER — Other Ambulatory Visit: Payer: Self-pay

## 2019-11-12 DIAGNOSIS — M1990 Unspecified osteoarthritis, unspecified site: Secondary | ICD-10-CM | POA: Insufficient documentation

## 2019-11-12 DIAGNOSIS — Z20822 Contact with and (suspected) exposure to covid-19: Secondary | ICD-10-CM | POA: Insufficient documentation

## 2019-11-12 DIAGNOSIS — C61 Malignant neoplasm of prostate: Secondary | ICD-10-CM | POA: Insufficient documentation

## 2019-11-12 DIAGNOSIS — Z9109 Other allergy status, other than to drugs and biological substances: Secondary | ICD-10-CM | POA: Insufficient documentation

## 2019-11-12 DIAGNOSIS — K219 Gastro-esophageal reflux disease without esophagitis: Secondary | ICD-10-CM | POA: Insufficient documentation

## 2019-11-12 DIAGNOSIS — E119 Type 2 diabetes mellitus without complications: Secondary | ICD-10-CM | POA: Insufficient documentation

## 2019-11-12 DIAGNOSIS — E7800 Pure hypercholesterolemia, unspecified: Secondary | ICD-10-CM | POA: Insufficient documentation

## 2019-11-12 DIAGNOSIS — G4730 Sleep apnea, unspecified: Secondary | ICD-10-CM | POA: Insufficient documentation

## 2019-11-12 HISTORY — DX: Type 2 diabetes mellitus without complications: E11.9

## 2019-11-12 HISTORY — DX: Other seasonal allergic rhinitis: J30.2

## 2019-11-12 HISTORY — DX: Presence of spectacles and contact lenses: Z97.3

## 2019-11-12 HISTORY — PX: RADIOACTIVE SEED IMPLANT: SHX5150

## 2019-11-12 HISTORY — PX: SPACE OAR INSTILLATION: SHX6769

## 2019-11-12 LAB — GLUCOSE, CAPILLARY
Glucose-Capillary: 92 mg/dL (ref 70–99)
Glucose-Capillary: 95 mg/dL (ref 70–99)

## 2019-11-12 SURGERY — INSERTION, RADIATION SOURCE, PROSTATE
Anesthesia: General | Site: Rectum

## 2019-11-12 MED ORDER — DEXAMETHASONE SODIUM PHOSPHATE 10 MG/ML IJ SOLN
INTRAMUSCULAR | Status: DC | PRN
Start: 2019-11-12 — End: 2019-11-12
  Administered 2019-11-12: 5 mg via INTRAVENOUS

## 2019-11-12 MED ORDER — SODIUM CHLORIDE (PF) 0.9 % IJ SOLN
INTRAMUSCULAR | Status: DC | PRN
  Administered 2019-11-12: 10 mL

## 2019-11-12 MED ORDER — FENTANYL CITRATE (PF) 100 MCG/2ML IJ SOLN
INTRAMUSCULAR | Status: AC
  Filled 2019-11-12: qty 2

## 2019-11-12 MED ORDER — OXYCODONE HCL 5 MG PO TABS: 5.0000 mg | ORAL_TABLET | Freq: Once | ORAL | Status: DC | PRN

## 2019-11-12 MED ORDER — LIDOCAINE 2% (20 MG/ML) 5 ML SYRINGE
INTRAMUSCULAR | Status: AC
  Filled 2019-11-12: qty 5

## 2019-11-12 MED ORDER — MIDAZOLAM HCL 2 MG/2ML IJ SOLN
INTRAMUSCULAR | Status: AC
  Filled 2019-11-12: qty 2

## 2019-11-12 MED ORDER — SODIUM CHLORIDE 0.9 % IR SOLN
Status: DC | PRN
  Administered 2019-11-12: 1000 mL via INTRAVESICAL

## 2019-11-12 MED ORDER — OXYCODONE HCL 5 MG/5ML PO SOLN: 5.0000 mg | Freq: Once | ORAL | Status: DC | PRN

## 2019-11-12 MED ORDER — MIDAZOLAM HCL 2 MG/2ML IJ SOLN
INTRAMUSCULAR | Status: DC | PRN
  Administered 2019-11-12: 2 mg via INTRAVENOUS

## 2019-11-12 MED ORDER — ONDANSETRON HCL 4 MG/2ML IJ SOLN
INTRAMUSCULAR | Status: DC | PRN
  Administered 2019-11-12: 4 mg via INTRAVENOUS

## 2019-11-12 MED ORDER — ONDANSETRON HCL 4 MG/2ML IJ SOLN
INTRAMUSCULAR | Status: AC
  Filled 2019-11-12: qty 2

## 2019-11-12 MED ORDER — LIDOCAINE HCL (CARDIAC) PF 100 MG/5ML IV SOSY
PREFILLED_SYRINGE | INTRAVENOUS | Status: DC | PRN
  Administered 2019-11-12: 100 mg via INTRAVENOUS

## 2019-11-12 MED ORDER — FLEET ENEMA 7-19 GM/118ML RE ENEM: 1.0000 | ENEMA | Freq: Once | RECTAL | Status: DC

## 2019-11-12 MED ORDER — ACETAMINOPHEN 10 MG/ML IV SOLN: 1000.0000 mg | Freq: Once | INTRAVENOUS | Status: DC | PRN

## 2019-11-12 MED ORDER — CEFAZOLIN SODIUM-DEXTROSE 2-4 GM/100ML-% IV SOLN
INTRAVENOUS | Status: AC
  Filled 2019-11-12: qty 100

## 2019-11-12 MED ORDER — FENTANYL CITRATE (PF) 100 MCG/2ML IJ SOLN
INTRAMUSCULAR | Status: DC | PRN
  Administered 2019-11-12 (×2): 50 ug via INTRAVENOUS
  Administered 2019-11-12 (×2): 25 ug via INTRAVENOUS

## 2019-11-12 MED ORDER — LACTATED RINGERS IV SOLN: INTRAVENOUS | Status: DC

## 2019-11-12 MED ORDER — IOHEXOL 300 MG/ML  SOLN
INTRAMUSCULAR | Status: DC | PRN
  Administered 2019-11-12: 7 mL

## 2019-11-12 MED ORDER — CEFAZOLIN SODIUM-DEXTROSE 2-4 GM/100ML-% IV SOLN
2.0000 g | Freq: Once | INTRAVENOUS | Status: AC
  Administered 2019-11-12: 2 g via INTRAVENOUS
  Administered 2019-11-12: 1 g via INTRAVENOUS

## 2019-11-12 MED ORDER — PROPOFOL 10 MG/ML IV BOLUS
INTRAVENOUS | Status: AC
  Filled 2019-11-12: qty 20

## 2019-11-12 MED ORDER — PROPOFOL 10 MG/ML IV BOLUS
INTRAVENOUS | Status: DC | PRN
  Administered 2019-11-12: 250 mg via INTRAVENOUS

## 2019-11-12 MED ORDER — FENTANYL CITRATE (PF) 100 MCG/2ML IJ SOLN: 25.0000 ug | INTRAMUSCULAR | Status: DC | PRN

## 2019-11-12 MED ORDER — CEFAZOLIN SODIUM 1 G IJ SOLR
INTRAMUSCULAR | Status: AC
  Filled 2019-11-12: qty 10

## 2019-11-12 SURGICAL SUPPLY — 34 items
BAG URINE DRAIN 2000ML AR STRL (UROLOGICAL SUPPLIES) ×4 IMPLANT
BLADE CLIPPER SENSICLIP SURGIC (BLADE) ×4 IMPLANT
CATH FOLEY 2WAY SLVR  5CC 16FR (CATHETERS) ×2
CATH FOLEY 2WAY SLVR 5CC 16FR (CATHETERS) ×2 IMPLANT
CATH ROBINSON RED A/P 16FR (CATHETERS) IMPLANT
CATH ROBINSON RED A/P 20FR (CATHETERS) ×4 IMPLANT
CLOTH BEACON ORANGE TIMEOUT ST (SAFETY) ×4 IMPLANT
CNTNR URN SCR LID CUP LEK RST (MISCELLANEOUS) ×4 IMPLANT
CONT SPEC 4OZ STRL OR WHT (MISCELLANEOUS) ×4
COVER BACK TABLE 60X90IN (DRAPES) ×4 IMPLANT
COVER MAYO STAND STRL (DRAPES) ×4 IMPLANT
DRAPE U-SHAPE 47X51 STRL (DRAPES) ×4 IMPLANT
DRSG TEGADERM 4X4.75 (GAUZE/BANDAGES/DRESSINGS) ×4 IMPLANT
DRSG TEGADERM 8X12 (GAUZE/BANDAGES/DRESSINGS) ×8 IMPLANT
GAUZE SPONGE 4X4 12PLY STRL LF (GAUZE/BANDAGES/DRESSINGS) ×4 IMPLANT
GLOVE BIO SURGEON STRL SZ7.5 (GLOVE) ×4 IMPLANT
GLOVE BIO SURGEON STRL SZ8 (GLOVE) ×8 IMPLANT
GLOVE SURG ORTHO 8.5 STRL (GLOVE) ×4 IMPLANT
GLOVE SURG SS PI 6.5 STRL IVOR (GLOVE) IMPLANT
GOWN STRL REUS W/TWL XL LVL3 (GOWN DISPOSABLE) ×4 IMPLANT
HOLDER FOLEY CATH W/STRAP (MISCELLANEOUS) ×4 IMPLANT
I-SEED AGX 100 ×284 IMPLANT
IMPL SPACEOAR VUE SYSTEM (Spacer) ×2 IMPLANT
IMPLANT SPACEOAR VUE SYSTEM (Spacer) ×4 IMPLANT
IV NS 1000ML (IV SOLUTION) ×2
IV NS 1000ML BAXH (IV SOLUTION) ×2 IMPLANT
KIT TURNOVER CYSTO (KITS) ×4 IMPLANT
MARKER SKIN DUAL TIP RULER LAB (MISCELLANEOUS) ×4 IMPLANT
PACK CYSTO (CUSTOM PROCEDURE TRAY) ×4 IMPLANT
SUT BONE WAX W31G (SUTURE) IMPLANT
SYR 10ML LL (SYRINGE) ×4 IMPLANT
TOWEL OR 17X26 10 PK STRL BLUE (TOWEL DISPOSABLE) ×4 IMPLANT
UNDERPAD 30X30 (UNDERPADS AND DIAPERS) ×8 IMPLANT
WATER STERILE IRR 500ML POUR (IV SOLUTION) ×4 IMPLANT

## 2019-11-12 NOTE — Interval H&P Note (Signed)
History and Physical Interval Note:  11/12/2019 9:44 AM  Marc Kim  has presented today for surgery, with the diagnosis of PROSTATE CANCER.  The various methods of treatment have been discussed with the patient and family. After consideration of risks, benefits and other options for treatment, the patient has consented to  Procedure(s) with comments: RADIOACTIVE SEED IMPLANT/BRACHYTHERAPY IMPLANT (N/A) - 90 MINS SPACE OAR INSTILLATION (N/A) as a surgical intervention.  The patient's history has been reviewed, patient examined, no change in status, stable for surgery.  I have reviewed the patient's chart and labs.  Questions were answered to the patient's satisfaction.     Lillette Boxer Tenna Lacko

## 2019-11-12 NOTE — Transfer of Care (Signed)
Immediate Anesthesia Transfer of Care Note  Patient: Marc Kim  Procedure(s) Performed: RADIOACTIVE SEED IMPLANT/BRACHYTHERAPY IMPLANT (N/A Prostate) SPACE OAR INSTILLATION (N/A Rectum)  Patient Location: PACU  Anesthesia Type:General  Level of Consciousness: drowsy  Airway & Oxygen Therapy: Patient Spontanous Breathing and Patient connected to face mask oxygen  Post-op Assessment: Report given to RN and Post -op Vital signs reviewed and stable  Post vital signs: Reviewed and stable  Last Vitals:  Vitals Value Taken Time  BP 125/83 11/12/19 1130  Temp    Pulse 66 11/12/19 1137  Resp 10 11/12/19 1137  SpO2 100 % 11/12/19 1137  Vitals shown include unvalidated device data.  Last Pain:  Vitals:   11/12/19 0817  TempSrc: Oral         Complications: No complications documented.

## 2019-11-12 NOTE — Anesthesia Postprocedure Evaluation (Signed)
Anesthesia Post Note  Patient: Marc Kim  Procedure(s) Performed: RADIOACTIVE SEED IMPLANT/BRACHYTHERAPY IMPLANT (N/A Prostate) SPACE OAR INSTILLATION (N/A Rectum)     Patient location during evaluation: PACU Anesthesia Type: General Level of consciousness: awake and alert Pain management: pain level controlled Vital Signs Assessment: post-procedure vital signs reviewed and stable Respiratory status: spontaneous breathing, nonlabored ventilation, respiratory function stable and patient connected to nasal cannula oxygen Cardiovascular status: blood pressure returned to baseline and stable Postop Assessment: no apparent nausea or vomiting Anesthetic complications: no   No complications documented.  Last Vitals:  Vitals:   11/12/19 1149 11/12/19 1200  BP:  (!) 147/99  Pulse: 60 71  Resp: 12 14  Temp:    SpO2: 100% 100%    Last Pain:  Vitals:   11/12/19 1200  TempSrc:   PainSc: 0-No pain                 [place] Basham S

## 2019-11-12 NOTE — Discharge Instructions (Signed)
°  Post Anesthesia Home Care Instructions ° °Activity: °Get plenty of rest for the remainder of the day. A responsible adult should stay with you for 24 hours following the procedure.  °For the next 24 hours, DO NOT: °-Drive a car °-Operate machinery °-Drink alcoholic beverages °-Take any medication unless instructed by your physician °-Make any legal decisions or sign important papers. ° °Meals: °Start with liquid foods such as gelatin or soup. Progress to regular foods as tolerated. Avoid greasy, spicy, heavy foods. If nausea and/or vomiting occur, drink only clear liquids until the nausea and/or vomiting subsides. Call your physician if vomiting continues. ° °Special Instructions/Symptoms: °Your throat may feel dry or sore from the anesthesia or the breathing tube placed in your throat during surgery. If this causes discomfort, gargle with warm salt water. The discomfort should disappear within 24 hours. ° °If you had a scopolamine patch placed behind your ear for the management of post- operative nausea and/or vomiting: ° °1. The medication in the patch is effective for 72 hours, after which it should be removed.  Wrap patch in a tissue and discard in the trash. Wash hands thoroughly with soap and water. °2. You may remove the patch earlier than 72 hours if you experience unpleasant side effects which may include dry mouth, dizziness or visual disturbances. °3. Avoid touching the patch. Wash your hands with soap and water after contact with the patch. °  °Radioactive Seed Implant Home Care Instructions ° ° °Activity:    Rest for the remainder of the day.  Do not drive or operate equipment today.  You may resume normal  activities in a few days as instructed by your physician, without risk of harmful radiation exposure to those around you, provided you follow the time and distance precautions on the Radiation Oncology Instruction Sheet. ° ° °Meals: Drink plenty of lipuids and eat light foods, such as gelatin or  soup this evening .  You may return to normal meal plan tomorrow. ° °Return °To Work: You may return to work as instructed by your physician. ° °Special °Instruction:   If any seeds are found, use tweezers to pick up seeds and place in a glass container of any kind and bring to your physician's office. ° °Call your physician if any of these symptoms occur: ° °· Persistent or heavy bleeding °· Urine stream diminishes or stops completely after catheter is removed °· Fever equal to or greater than 101 degrees F °· Cloudy urine with a strong foul odor °· Severe pain ° °You may feel some burning pain and/or hesitancy when you urinate after the catheter is removed.  These symptoms may increase over the next few weeks, but should diminish within forur to six weeks.  Applying moist heat to the lower abdomen or a hot tub bath may help relieve the pain.  If the discomfort becomes severe, please call your physician for additional medications. ° ° ° °

## 2019-11-12 NOTE — Anesthesia Preprocedure Evaluation (Signed)
Anesthesia Evaluation  Patient identified by MRN, date of birth, ID band Patient awake    Reviewed: Allergy & Precautions, NPO status , Patient's Chart, lab work & pertinent test results  Airway Mallampati: II  TM Distance: >3 FB Neck ROM: Full    Dental no notable dental hx.    Pulmonary sleep apnea ,    Pulmonary exam normal breath sounds clear to auscultation       Cardiovascular negative cardio ROS Normal cardiovascular exam Rhythm:Regular Rate:Normal     Neuro/Psych negative neurological ROS  negative psych ROS   GI/Hepatic Neg liver ROS, GERD  Medicated,  Endo/Other  diabetes  Renal/GU Renal InsufficiencyRenal disease  negative genitourinary   Musculoskeletal negative musculoskeletal ROS (+)   Abdominal   Peds negative pediatric ROS (+)  Hematology negative hematology ROS (+)   Anesthesia Other Findings   Reproductive/Obstetrics negative OB ROS                             Anesthesia Physical Anesthesia Plan  ASA: III  Anesthesia Plan: General   Post-op Pain Management:    Induction: Intravenous  PONV Risk Score and Plan: 2 and Ondansetron, Dexamethasone and Treatment may vary due to age or medical condition  Airway Management Planned: LMA  Additional Equipment:   Intra-op Plan:   Post-operative Plan: Extubation in OR  Informed Consent: I have reviewed the patients History and Physical, chart, labs and discussed the procedure including the risks, benefits and alternatives for the proposed anesthesia with the patient or authorized representative who has indicated his/her understanding and acceptance.     Dental advisory given  Plan Discussed with: CRNA and Surgeon  Anesthesia Plan Comments:         Anesthesia Quick Evaluation

## 2019-11-12 NOTE — H&P (Signed)
H&P  Chief Complaint: Prostate cancer  History of Present Illness: PT here for I-125 brachytherapy. His hx is as  follows:  This man underwent ultrasound and biopsy of his prostate on 2.23.2021.  At that time, PSA was 7.9.  Prostate volume 43 mL.  PSAD 0.18.  4/12 cores revealed adenocarcinoma: 3 cores revealed GS 3+3 pattern-left base lateral, left base medial and left mid medial with 90%, 20% and 3% of core involved with cancer. 1 core revealed GS 3+4 pattern,.1 was located in the left apex lateral zone, 60% of core involved with cancer.  He has consulted with Dr Tammi Klippel and has chosen to proceed w/ I 125 brachytherapy.  Past Medical History:  Diagnosis Date  . Acid reflux   . Arthritis   . Diabetes mellitus without complication ([place])   . Gout   . High cholesterol   . Prostate cancer ([place])   . Seasonal allergies   . Sleep apnea   . Wears glasses     Past Surgical History:  Procedure Laterality Date  . CARPAL TUNNEL RELEASE Left   . COLONOSCOPY    . HIATAL HERNIA REPAIR  2002?  . PROSTATE BIOPSY      Home Medications:    Allergies:  Allergies  Allergen Reactions  . Mixed Ragweed     Watery eyes, sneezing    Family History  Problem Relation Age of Onset  . Breast cancer Neg Hx   . Prostate cancer Neg Hx   . Colon cancer Neg Hx   . Pancreatic cancer Neg Hx     Social History:  reports that he has never smoked. He has never used smokeless tobacco. He reports current alcohol use. He reports that he does not use drugs.  ROS: A complete review of systems was performed.  All systems are negative except for pertinent findings as noted.  Physical Exam:  Vital signs in last 24 hours: BP 139/88   Pulse 64   Temp 97.6 F (36.4 C) (Oral)   Resp 18   Ht 6\' 3"  (1.905 m)   Wt 128 kg   SpO2 100%   BMI 35.26 kg/m  Constitutional:  Alert and oriented, No acute distress Cardiovascular: Regular rate  Respiratory: Normal respiratory effort GI: Abdomen is soft,  nontender, nondistended, no abdominal masses. No CVAT.  Genitourinary: Normal male phallus, testes are descended bilaterally and non-tender and without masses, scrotum is normal in appearance without lesions or masses, perineum is normal on inspection. Lymphatic: No lymphadenopathy Neurologic: Grossly intact, no focal deficits Psychiatric: Normal mood and affect  Laboratory Data:  Recent Labs    11/10/19 0949  WBC 4.8  HGB 13.4  HCT 44.2  PLT 211    Recent Labs    11/10/19 0949  NA 140  K 3.9  CL 107  GLUCOSE 155*  BUN 20  CALCIUM 9.3  CREATININE 1.77*     No results found for this or any previous visit (from the past 24 hour(s)). Recent Results (from the past 240 hour(s))  SARS CORONAVIRUS 2 (TAT 6-24 HRS) Nasopharyngeal Nasopharyngeal Swab     Status: None   Collection Time: 11/10/19 10:35 AM   Specimen: Nasopharyngeal Swab  Result Value Ref Range Status   SARS Coronavirus 2 NEGATIVE NEGATIVE Final    Comment: (NOTE) SARS-CoV-2 target nucleic acids are NOT DETECTED.  The SARS-CoV-2 RNA is generally detectable in upper and lower respiratory specimens during the acute phase of infection. Negative results do not preclude SARS-CoV-2 infection, do not  rule out co-infections with other pathogens, and should not be used as the sole basis for treatment or other patient management decisions. Negative results must be combined with clinical observations, patient history, and epidemiological information. The expected result is Negative.  Fact Sheet for Patients: SugarRoll.be  Fact Sheet for Healthcare Providers: https://www.woods-mathews.com/  This test is not yet approved or cleared by the [place] FDA and  has been authorized for detection and/or diagnosis of SARS-CoV-2 by FDA under an Emergency Use Authorization (EUA). This EUA will remain  in effect (meaning this test can be used) for the duration of the COVID-19  declaration under Se ction 564(b)(1) of the Act, 21 U.S.C. section 360bbb-3(b)(1), unless the authorization is terminated or revoked sooner.  Performed at [facility], [place] [address] [place], [place] [address]     Renal Function: Recent Labs    11/10/19 0233  CREATININE 1.77*   Estimated Creatinine Clearance: 69.6 mL/min (A) (by C-G formula based on SCr of 1.77 mg/dL (H)).  Radiologic Imaging: No results found.  Impression/Assessment:  PCa--low/intermediate risk  Plan:  I-125 brachytherapy

## 2019-11-12 NOTE — Anesthesia Procedure Notes (Signed)
Procedure Name: LMA Insertion Date/Time: 11/12/2019 10:10 AM Performed by: Raenette Rover, CRNA Pre-anesthesia Checklist: Patient identified, Emergency Drugs available, Suction available and Patient being monitored Patient Re-evaluated:Patient Re-evaluated prior to induction Oxygen Delivery Method: Circle system utilized Preoxygenation: Pre-oxygenation with 100% oxygen Induction Type: IV induction LMA: LMA inserted LMA Size: 5.0 Number of attempts: 1 Placement Confirmation: breath sounds checked- equal and bilateral and positive ETCO2 Tube secured with: Tape Dental Injury: Teeth and Oropharynx as per pre-operative assessment

## 2019-11-12 NOTE — Op Note (Signed)
Preoperative diagnosis: Clinical stage TI C adenocarcinoma the prostate   Postoperative diagnosis: Same   Procedure: I-125 prostate seed implantation, SpaceOAR placement, flexible cystoscopy  Surgeon: Lillette Boxer. Percival Glasheen M.D.  Radiation Oncologist: Tyler Pita, M.D.  Anesthesia: Gen.   Indications: Patient  was diagnosed with clinical stage TI C prostate cancer. We had extensive discussion with him about treatment options versus. He elected to proceed with seed implantation. He underwent consultation my office as well as with Dr. Tammi Klippel. He appeared to understand the advantages disadvantages potential risks of this treatment option. Full informed consent has been obtained.   Technique and findings: Patient was brought the operating room where he had successful induction of general anesthesia. He was placed in dorso-lithotomy position and prepped and draped in usual manner. Appropriate surgical timeout was performed. Radiation oncology department placed a transrectal ultrasound probe anchoring stand. Foley catheter with contrast in the balloon was inserted without difficulty. Anchoring needles were placed within the prostate. Rectal tube was placed. Real-time contouring of the urethra prostate and rectum were performed and the dosing parameters were established. Targeted dose was 145 gray.  I was then called  to the operating suite suite for placement of the needles. A second timeout was performed. All needle passage was done with real-time transrectal ultrasound guidance with the sagittal plane. A total of 24 needles were placed.  71 active seeds were implanted.  I then proceeded with placement of SpaceOAR by introducing a needle with the bevel angled inferiorly approximately 2 cm superior to the anus. This was angled downward and under direct ultrasound was placed within the space between the prostatic capsule and rectum. This was confirmed with a small amount of sterile saline injected and  this was performed under direct ultrasound. I then attached the SpaceOAR to the needle and injected this in the space between the prostate and rectum with good placement noted. The Foley catheter was removed and flexible cystoscopy failed to show any seeds outside the prostate.  The patient was brought to recovery room in stable condition, having tolerated the procedure well.Marland Kitchen

## 2019-11-13 ENCOUNTER — Encounter (HOSPITAL_BASED_OUTPATIENT_CLINIC_OR_DEPARTMENT_OTHER): Payer: Self-pay | Admitting: Urology

## 2019-11-13 NOTE — Progress Notes (Signed)
  Radiation Oncology         [phone] ________________________________  Name: Marc Kim MRN: [id number]  Date: 11/13/2019  DOB: 1965/05/29       Prostate Seed Implant  YH:CWCB, Barry Dienes, MD  No ref. provider found  DIAGNOSIS:  Oncology History  Malignant neoplasm of prostate ([place])  07/03/2019 Cancer Staging   Staging form: Prostate, AJCC 8th Edition - Clinical stage from 07/03/2019: Stage IIB (cT1c, cN0, cM0, PSA: 7.9, Grade Group: 2) - Signed by Freeman Caldron, PA-C on 09/01/2019   09/01/2019 Initial Diagnosis   Malignant neoplasm of prostate ([place] [facility])     No diagnosis found.  PROCEDURE: Insertion of radioactive I-125 seeds into the prostate gland.  RADIATION DOSE: 145 Gy, definitive therapy.  TECHNIQUE: [name] was brought to the operating room with the urologist. He was placed in the dorsolithotomy position. He was catheterized and a rectal tube was inserted. The perineum was shaved, prepped and draped. The ultrasound probe was then introduced into the rectum to see the prostate gland.  TREATMENT DEVICE: A needle grid was attached to the ultrasound probe stand and anchor needles were placed.  3D PLANNING: The prostate was imaged in 3D using a sagittal sweep of the prostate probe. These images were transferred to the planning computer. There, the prostate, urethra and rectum were defined on each axial reconstructed image. Then, the software created an optimized 3D plan and a few seed positions were adjusted. The quality of the plan was reviewed using [facility] information for the target and the following two organs at risk:  Urethra and Rectum.  Then the accepted plan was printed and handed off to the radiation therapist.  Under my supervision, the custom loading of the seeds and spacers was carried out and loaded into sealed vicryl sleeves.  These pre-loaded needles were then placed into the needle holder.Marland Kitchen  PROSTATE VOLUME STUDY:  Using transrectal ultrasound the volume of the  prostate was verified to be 51 cc.  SPECIAL TREATMENT PROCEDURE/SUPERVISION AND HANDLING: The pre-loaded needles were then delivered under sagittal guidance. A total of 24 needles were used to deposit 71 seeds in the prostate gland. The individual seed activity was 0.490 mCi.  SpaceOAR:  Yes  COMPLEX SIMULATION: At the end of the procedure, an anterior radiograph of the pelvis was obtained to document seed positioning and count. Cystoscopy was performed to check the urethra and bladder.  MICRODOSIMETRY: At the end of the procedure, the patient was emitting 0.112 mR/hr at 1 meter. Accordingly, he was considered safe for hospital discharge.  PLAN: The patient will return to the radiation oncology clinic for post implant CT dosimetry in three weeks.   ________________________________  Sheral Apley Tammi Klippel, M.D.

## 2019-11-17 ENCOUNTER — Ambulatory Visit: Payer: BC Managed Care – PPO | Admitting: Urology

## 2019-11-18 ENCOUNTER — Telehealth: Payer: Self-pay

## 2019-11-18 NOTE — Telephone Encounter (Signed)
Short term disability paperwork completed by Dr. Diona Fanti. Copy was faxed to number listed on sheet and copy created and left for pt at front desk. Left message for pt regarding status of paperwork.

## 2019-11-20 ENCOUNTER — Telehealth: Payer: Self-pay | Admitting: *Deleted

## 2019-11-20 NOTE — Telephone Encounter (Signed)
RETURNED PATIENT'S PHONE CALL, SPOKE WITH PATIENT. ?

## 2019-11-21 ENCOUNTER — Ambulatory Visit: Payer: BC Managed Care – PPO | Admitting: Radiation Oncology

## 2019-11-30 NOTE — Progress Notes (Signed)
  Radiation Oncology         [phone] ________________________________  Name: Marc Kim MRN: [id number]  Date: 12/03/2019  DOB: 1966/01/15  COMPLEX SIMULATION NOTE  NARRATIVE:  The patient was brought to the [facility] today following prostate seed implantation approximately one month ago.  Identity was confirmed.  All relevant records and images related to the planned course of therapy were reviewed.  Then, the patient was set-up supine.  CT images were obtained.  The CT images were loaded into the planning software.  Then the prostate and rectum were contoured.  Treatment planning then occurred.  The implanted iodine 125 seeds were identified by the physics staff for projection of radiation distribution  I have requested : 3D Simulation  I have requested a DVH of the following structures: Prostate and rectum.    ________________________________  Sheral Apley Tammi Klippel, M.D.

## 2019-12-02 ENCOUNTER — Telehealth: Payer: Self-pay | Admitting: *Deleted

## 2019-12-02 NOTE — Telephone Encounter (Signed)
Called patient to remind of post seed appts. For 12-03-19, spoke with patient and he is aware of these appts.

## 2019-12-02 NOTE — Progress Notes (Signed)
Radiation Oncology         [phone] ________________________________  Name: Marc Kim MRN: [id number]  Date: 12/03/2019  DOB: February 17, 1966  Post-Seed Follow-Up Visit Note  CC: Ranae Palms, MD  Franchot Gallo, MD  Diagnosis:   54 y.o. gentleman with Stage T1c adenocarcinoma of the prostate with Gleason score of 3+4, and PSA of 7.9.    ICD-10-CM   1. Malignant neoplasm of prostate ([place])  C61     Interval Since Last Radiation:  3 weeks 11/12/2019:  Insertion of radioactive I-125 seeds into the prostate gland; 145 Gy, definitive therapy with placement of SpaceOAR VUE gel.  Narrative:  The patient returns today for routine follow-up.  He is complaining of increased urinary frequency and urinary hesitation symptoms. He filled out a questionnaire regarding urinary function today providing and overall IPSS score of 11 characterizing his symptoms as moderate with increased frequency, urgency, hesitancy, intermittency and nocturia x2/night.  He specifically denies gross hematuria, dysuria, fever, chills or night sweats.  His pre-implant score was 1. He denies any abdominal pain or bowel symptoms.   ALLERGIES:  is allergic to mixed ragweed.  Meds: Current Outpatient Medications  Medication Sig Dispense Refill  . colchicine 0.6 MG tablet Take 0.6 mg by mouth as needed.     . D3-50 1.25 MG (50000 UT) capsule Take 50,000 Units by mouth once a week.    . fexofenadine (ALLEGRA) 180 MG tablet Take 180 mg by mouth every morning.     . gabapentin (NEURONTIN) 300 MG capsule Take 300 mg by mouth every evening.     Marland Kitchen ibuprofen (ADVIL) 800 MG tablet Take 800 mg by mouth 3 (three) times daily as needed.    Marland Kitchen omeprazole (PRILOSEC) 20 MG capsule Take 20 mg by mouth every morning.     . psyllium (REGULOID) 0.52 g capsule Take 0.52 g by mouth daily.    . sildenafil (VIAGRA) 100 MG tablet Take 1 tablet (100 mg total) by mouth as needed for erectile dysfunction. (Patient not taking: Reported on  09/01/2019) 20 tablet 11  . triamcinolone cream (KENALOG) 0.1 % 2 (two) times daily.     . TRULICITY 1.5 WN/0.2VO SOPN Inject 1.5 mg into the skin once a week. Every Sunday     No current facility-administered medications for this encounter.    Physical Findings: In general this is a well appearing African-American male in no acute distress. He's alert and oriented x4 and appropriate throughout the examination. Cardiopulmonary assessment is negative for acute distress and he exhibits normal effort.   Lab Findings: Lab Results  Component Value Date   WBC 4.8 11/10/2019   HGB 13.4 11/10/2019   HCT 44.2 11/10/2019   MCV 85.7 11/10/2019   PLT 211 11/10/2019    Radiographic Findings:  Patient underwent CT imaging in our clinic for post implant dosimetry. The CT will be reviewed by Dr. Tammi Klippel to confirm there is an adequate distribution of radioactive seeds throughout the prostate gland and ensure that there are no seeds in or near the rectum. We suspect the final radiation plan and dosimetry will show appropriate coverage of the prostate gland. He understands that we will call and inform him of any unexpected findings on further review of his imaging and dosimetry.  Impression/Plan: 54 y.o. gentleman with Stage T1c adenocarcinoma of the prostate with Gleason score of 3+4, and PSA of 7.9. The patient is recovering from the effects of radiation. His urinary symptoms should gradually improve over the next  4-6 months. We talked about this today. He is encouraged by his improvement already and is otherwise pleased with his outcome. We also talked about long-term follow-up for prostate cancer following seed implant. He understands that ongoing PSA determinations and digital rectal exams will help perform surveillance to rule out disease recurrence. He has a follow up appointment scheduled with Dr. Diona Fanti on 12/08/2019. He understands what to expect with his PSA measures. Patient was also educated today  about some of the long-term effects from radiation including a small risk for rectal bleeding and possibly erectile dysfunction. We talked about some of the general management approaches to these potential complications. However, I did encourage the patient to contact our office or return at any point if he has questions or concerns related to his previous radiation and prostate cancer.  Today, a comprehensive survivorship care plan and treatment summary was reviewed with the patient today detailing his prostate cancer diagnosis, treatment course, potential late/long-term effects of treatment, appropriate follow-up care with recommendations for the future, and patient education resources.  A copy of this summary, along with a letter will be sent to the patient's primary care provider via fax after today's visit.  2. Cancer screening:  Due to Marc Kim history and his age, he should receive screening for skin cancers and colon cancer.  The information and recommendations are listed on the patient's comprehensive care plan/treatment summary and were reviewed in detail with the patient.     3. Health maintenance and wellness promotion: Marc Kim was encouraged to consume 5-7 servings of fruits and vegetables per day. He was provided a copy of the "Nutrition Rainbow" handout, as well as the handout "Take Control of Your Health and [place]" from the [place].  He was also encouraged to engage in moderate to vigorous exercise for 30 minutes per day most days of the week. Information was provided regarding the [facility] fitness program, which is designed for cancer survivors to help them become more physically fit after cancer treatments. We discussed that a healthy BMI is 18.5-24.9 and that maintaining a healthy weight reduces risk of cancer recurrences.  He was instructed to limit his alcohol consumption and continue to abstain from tobacco use.  Lastly, he was encouraged to  use sunscreen and wear protective clothing when in the sun.     4. Support services/counseling: It is not uncommon for this period of the patient's cancer care trajectory to be one of many emotions and stressors.  Marc Kim was encouraged to take advantage of our many support services programs, support groups, and/or counseling in coping with his new life as a cancer survivor after completing anti-cancer treatment.  He was offered support today through active listening and expressive supportive counseling.  He was given information regarding our available services and encouraged to contact me with any questions or for help enrolling in any of our support group/programs.      Nicholos Johns, PA-C

## 2019-12-03 ENCOUNTER — Encounter: Payer: Self-pay | Admitting: Medical Oncology

## 2019-12-03 ENCOUNTER — Ambulatory Visit
Admission: RE | Admit: 2019-12-03 | Discharge: 2019-12-03 | Disposition: A | Discharge status: Home / Self Care | Payer: BC Managed Care – PPO | Source: Ambulatory Visit | Attending: Urology | Admitting: Urology

## 2019-12-03 ENCOUNTER — Encounter: Payer: Self-pay | Admitting: Urology

## 2019-12-03 ENCOUNTER — Ambulatory Visit
Admission: RE | Admit: 2019-12-03 | Discharge: 2019-12-03 | Disposition: A | Discharge status: Home / Self Care | Payer: BC Managed Care – PPO | Source: Ambulatory Visit | Attending: Radiation Oncology | Admitting: Radiation Oncology

## 2019-12-03 ENCOUNTER — Other Ambulatory Visit: Payer: Self-pay

## 2019-12-03 VITALS — BP 121/85 | HR 71 | Temp 98.0°F | Resp 20 | Ht 75.0 in | Wt 279.6 lb

## 2019-12-03 DIAGNOSIS — C61 Malignant neoplasm of prostate: Secondary | ICD-10-CM | POA: Insufficient documentation

## 2019-12-03 NOTE — Progress Notes (Signed)
Patient state he is doing well post seed implant. He continues to have nocturia 2-3 times but with no pain or hematuria. He states his urinary symptoms continue to improve daily. He has follow up with Dr. Diona Fanti 8/3.

## 2019-12-03 NOTE — Progress Notes (Signed)
Patient here today for a post seed appointment. Patient states nocturia 2-3 times per night. Patient states mild dysuria. Patient denies hematuria. Patient states urine stream is moderate however this morning it was weak. Patient states that he is emptying his bladder completely most of the time. Patient denies having urgency. Patient denies hesitancy or straining. Patient states leakage post void.

## 2019-12-08 ENCOUNTER — Ambulatory Visit (INDEPENDENT_AMBULATORY_CARE_PROVIDER_SITE_OTHER): Payer: BC Managed Care – PPO | Admitting: Urology

## 2019-12-08 ENCOUNTER — Encounter: Payer: Self-pay | Admitting: Urology

## 2019-12-08 ENCOUNTER — Other Ambulatory Visit: Payer: Self-pay

## 2019-12-08 VITALS — BP 130/86 | HR 60 | Temp 98.2°F | Ht 75.0 in | Wt 279.6 lb

## 2019-12-08 DIAGNOSIS — C61 Malignant neoplasm of prostate: Secondary | ICD-10-CM

## 2019-12-08 LAB — URINALYSIS, ROUTINE W REFLEX MICROSCOPIC
Bilirubin, UA: NEGATIVE
Glucose, UA: NEGATIVE
Ketones, UA: NEGATIVE
Leukocytes,UA: NEGATIVE
Nitrite, UA: NEGATIVE
Protein,UA: NEGATIVE
RBC, UA: NEGATIVE
Specific Gravity, UA: 1.025 (ref 1.005–1.030)
Urobilinogen, Ur: 0.2 mg/dL (ref 0.2–1.0)
pH, UA: 5.5 (ref 5.0–7.5)

## 2019-12-08 MED ORDER — ALFUZOSIN HCL ER 10 MG PO TB24: 10.0000 mg | ORAL_TABLET | Freq: Every day | ORAL | 11 refills | Status: DC

## 2019-12-08 NOTE — Progress Notes (Signed)
Urological Symptom Review  Patient is experiencing the following symptoms: Frequent urination Burning/pain with urination Get up at night to urinate Leakage of urine Stream starts and stops Painful intercourse Erection problems (male only) Penile pain (male only)    Review of Systems  Gastrointestinal (upper)  : Nausea  Gastrointestinal (lower) : Diarrhea Constipation  Constitutional : Night Sweats Fatigue  Skin: Itching  Eyes: Negative for eye symptoms  Ear/Nose/Throat : Negative for Ear/Nose/Throat symptoms  Hematologic/Lymphatic: Negative for Hematologic/Lymphatic symptoms  Cardiovascular : Negative for cardiovascular symptoms  Respiratory : Negative for respiratory symptoms  Endocrine: Negative for endocrine symptoms  Musculoskeletal: Negative for musculoskeletal symptoms  Neurological: Negative for neurological symptoms  Psychologic: Negative for psychiatric symptoms

## 2019-12-08 NOTE — Progress Notes (Signed)
H&P  Chief Complaint: Prostate cancer, follow-up  History of Present Illness: 54 year old male presenting for follow-up of prostate cancer.  TRUS/Bx on2.23.2021. At that time, PSA was 7.9. Prostate volume 43 mL. PSAD 0.18. 4/12 cores revealed adenocarcinoma: 3 cores revealed GS 3+3 pattern-left base lateral, left base medial and left mid medial with 90%, 20% and 3% of core involved with cancer. 1 core revealed GS 3+4 pattern,.1 was located in the left apex lateral zone, 60% of core involved with cancer.  IPSS 5  7.8.2021: I-125 brachytherapy/SpaceOAR placement  8.3.2021: He does complain of mild dysuria and urinary frequency.  He does have some discomfort with ejaculation as well.  He has had some blood on his toilet tissue.  No blood in the toilet water, however.  IPSS 12, quality-of-life score 3.  He would like to consider medical therapy. Past Medical History:  Diagnosis Date  . Acid reflux   . Arthritis   . Diabetes mellitus without complication ([place])   . Gout   . High cholesterol   . Prostate cancer ([place])   . Seasonal allergies   . Sleep apnea   . Wears glasses     Past Surgical History:  Procedure Laterality Date  . CARPAL TUNNEL RELEASE Left   . COLONOSCOPY    . HIATAL HERNIA REPAIR  2002?  . PROSTATE BIOPSY    . RADIOACTIVE SEED IMPLANT N/A 11/12/2019   Procedure: RADIOACTIVE SEED IMPLANT/BRACHYTHERAPY IMPLANT;  Surgeon: Franchot Gallo, MD;  Location[facility];  Service: Urology;  Laterality: N/A;  71 seeds  . SPACE OAR INSTILLATION N/A 11/12/2019   Procedure: SPACE OAR INSTILLATION;  Surgeon: Franchot Gallo, MD;  Location: [facility];  Service: Urology;  Laterality: N/A;    Home Medications:  Allergies as of 12/08/2019      Reactions   Mixed Ragweed    Watery eyes, sneezing      Medication List       Accurate as of December 08, 2019  8:48 AM. If you have any questions, ask your nurse or doctor.        colchicine  0.6 MG tablet Take 0.6 mg by mouth as needed.   D3-50 1.25 MG (50000 UT) capsule Generic drug: Cholecalciferol Take 50,000 Units by mouth once a week.   fexofenadine 180 MG tablet Commonly known as: ALLEGRA Take 180 mg by mouth every morning.   gabapentin 300 MG capsule Commonly known as: NEURONTIN Take 300 mg by mouth every evening.   ibuprofen 800 MG tablet Commonly known as: ADVIL Take 800 mg by mouth 3 (three) times daily as needed.   omeprazole 20 MG capsule Commonly known as: PRILOSEC Take 20 mg by mouth every morning.   psyllium 0.52 g capsule Commonly known as: REGULOID Take 0.52 g by mouth daily.   sildenafil 100 MG tablet Commonly known as: Viagra Take 1 tablet (100 mg total) by mouth as needed for erectile dysfunction.   triamcinolone cream 0.1 % Commonly known as: KENALOG 2 (two) times daily.   Trulicity 1.5 JQ/7.3AL Sopn Generic drug: Dulaglutide Inject 1.5 mg into the skin once a week. Every Sunday       Allergies:  Allergies  Allergen Reactions  . Mixed Ragweed     Watery eyes, sneezing    Family History  Problem Relation Age of Onset  . Breast cancer Neg Hx   . Prostate cancer Neg Hx   . Colon cancer Neg Hx   . Pancreatic cancer Neg Hx  Social History:  reports that he has never smoked. He has never used smokeless tobacco. He reports current alcohol use. He reports that he does not use drugs.  ROS: A complete review of systems was performed.  All systems are negative except for pertinent findings as noted.  Physical Exam:  Vital signs in last 24 hours: There were no vitals taken for this visit. Constitutional:  Alert and oriented, No acute distress Cardiovascular: Regular rate  Respiratory: Normal respiratory effort Neurologic: Grossly intact, no focal deficits Psychiatric: Normal mood and affect  I have reviewed prior pt notes  I have reviewed notes from referring/previous physicians  I have reviewed urinalysis  results  I have independently reviewed prior imaging  I have reviewed prior PSA results  Impression/Assessment:  PCA--1 month out from brachytherapy.  Mild urinary symptomatology bothersome  Mild ejaculatory pain  Plan:  1.  I will send in a prescription for an alpha-blocker  2.  I will see back in 4 months for PSA

## 2019-12-16 ENCOUNTER — Telehealth: Payer: Self-pay | Admitting: Urology

## 2019-12-16 NOTE — Telephone Encounter (Signed)
Pt called to ask if you could give him a note so that he can return to work on 12/20/19 with no restrictions. Thanks!!!

## 2019-12-16 NOTE — Telephone Encounter (Signed)
This is from [place]  8/3 - okay to release back to work without restrictions?  Impression/Assessment:  PCA--1 month out from brachytherapy.  Mild urinary symptomatology bothersome   Mild ejaculatory pain   Plan:  1.  I will send in a prescription for an alpha-blocker   2.  I will see back in 4 months for PSA

## 2019-12-22 NOTE — Telephone Encounter (Signed)
Called pt. Left message to return call. Work note created and printed.

## 2019-12-22 NOTE — Progress Notes (Signed)
Called pt about work note . Left message.

## 2019-12-22 NOTE — Telephone Encounter (Signed)
Yes, okay to go back to work without restrictions

## 2019-12-25 ENCOUNTER — Encounter: Payer: Self-pay | Admitting: Radiation Oncology

## 2019-12-25 ENCOUNTER — Ambulatory Visit
Admission: RE | Admit: 2019-12-25 | Discharge: 2019-12-25 | Disposition: A | Discharge status: Home / Self Care | Payer: BC Managed Care – PPO | Source: Ambulatory Visit | Attending: Radiation Oncology | Admitting: Radiation Oncology

## 2019-12-25 DIAGNOSIS — C61 Malignant neoplasm of prostate: Secondary | ICD-10-CM | POA: Insufficient documentation

## 2020-02-01 NOTE — Progress Notes (Signed)
  Radiation Oncology         [phone] ________________________________  Name: Marc Kim MRN: [id number]  Date: 12/25/2019  DOB: May 22, 1965  3D Planning Note   Prostate Brachytherapy Post-Implant Dosimetry  Diagnosis: 54 y.o. gentleman with Stage T1c adenocarcinoma of the prostate with Gleason score of 3+4, and PSA of 7.9  Narrative: On a previous date, [name] returned following prostate seed implantation for post implant planning. He underwent CT scan complex simulation to delineate the three-dimensional structures of the pelvis and demonstrate the radiation distribution.  Since that time, the seed localization, and complex isodose planning with dose volume histograms have now been completed.  Results:   Prostate Coverage - The dose of radiation delivered to the 90% or more of the prostate gland (D90) was 95.18% of the prescription dose. This exceeds our goal of greater than 90%. Rectal Sparing - The volume of rectal tissue receiving the prescription dose or higher was 0.0 cc. This falls under our thresholds tolerance of 1.0 cc.  Impression: The prostate seed implant appears to show adequate target coverage and appropriate rectal sparing.  Plan:  The patient will continue to follow with urology for ongoing PSA determinations. I would anticipate a high likelihood for local tumor control with minimal risk for rectal morbidity.  ________________________________  Sheral Apley Tammi Klippel, M.D.

## 2020-02-09 ENCOUNTER — Ambulatory Visit: Payer: BC Managed Care – PPO | Admitting: Urology

## 2020-02-09 ENCOUNTER — Other Ambulatory Visit: Payer: Self-pay

## 2020-02-09 ENCOUNTER — Ambulatory Visit (INDEPENDENT_AMBULATORY_CARE_PROVIDER_SITE_OTHER): Payer: BC Managed Care – PPO | Admitting: Urology

## 2020-02-09 ENCOUNTER — Encounter: Payer: Self-pay | Admitting: Urology

## 2020-02-09 VITALS — BP 122/78 | HR 59 | Temp 98.8°F | Ht 75.0 in | Wt 280.0 lb

## 2020-02-09 DIAGNOSIS — C61 Malignant neoplasm of prostate: Secondary | ICD-10-CM | POA: Diagnosis not present

## 2020-02-09 DIAGNOSIS — N521 Erectile dysfunction due to diseases classified elsewhere: Secondary | ICD-10-CM | POA: Diagnosis not present

## 2020-02-09 LAB — URINALYSIS, ROUTINE W REFLEX MICROSCOPIC
Bilirubin, UA: NEGATIVE
Glucose, UA: NEGATIVE
Ketones, UA: NEGATIVE
Leukocytes,UA: NEGATIVE
Nitrite, UA: NEGATIVE
Protein,UA: NEGATIVE
RBC, UA: NEGATIVE
Specific Gravity, UA: 1.025 (ref 1.005–1.030)
Urobilinogen, Ur: 0.2 mg/dL (ref 0.2–1.0)
pH, UA: 5.5 (ref 5.0–7.5)

## 2020-02-09 MED ORDER — SILDENAFIL CITRATE 100 MG PO TABS: 100.0000 mg | ORAL_TABLET | Freq: Every day | ORAL | 99 refills | Status: DC | PRN

## 2020-02-09 NOTE — Progress Notes (Signed)
Urological Symptom Review  Patient is experiencing the following symptoms: Get up at night to urinate Blood in urine Erection problems (male only)   Review of Systems  Gastrointestinal (upper)  : Negative for upper GI symptoms  Gastrointestinal (lower) : Negative for lower GI symptoms  Constitutional : Negative for symptoms  Skin: Itching  Eyes: Negative for eye symptoms  Ear/Nose/Throat : Negative for Ear/Nose/Throat symptoms  Hematologic/Lymphatic: Negative for Hematologic/Lymphatic symptoms  Cardiovascular : Negative for cardiovascular symptoms  Respiratory : Negative for respiratory symptoms  Endocrine: Negative for endocrine symptoms  Musculoskeletal: Joint pain  Neurological: Negative for neurological symptoms  Psychologic: Negative for psychiatric symptoms

## 2020-02-09 NOTE — Progress Notes (Signed)
H&P  Chief Complaint: F/U of PCa  History of Present Illness:   10.5.2021: Pt experiencing intermittent gross hematuria (initial in nature) but denies any other PCa/treatment related symptomatology. Pt reports that his stream has improved since starting alfuzosin but he lost his prescription for sildenafil and cannot currently speak to its effect. Pt curious whether his ED symptomatology will persist throughout his life.  He has not yet had PSA since his treatment/brachytherapy.   IPSS Questionnaire (AUA-7): Over the past month.   1)  How often have you had a sensation of not emptying your bladder completely after you finish urinating?  1 - Less than 1 time in 5  2)  How often have you had to urinate again less than two hours after you finished urinating? 1 - Less than 1 time in 5  3)  How often have you found you stopped and started again several times when you urinated?  0 - Not at all  4) How difficult have you found it to postpone urination?  2 - Less than half the time  5) How often have you had a weak urinary stream?  1 - Less than 1 time in 5  6) How often have you had to push or strain to begin urination?  0 - Not at all  7) How many times did you most typically get up to urinate from the time you went to bed until the time you got up in the morning?  1 - 1 time  Total score:  0-7 mildly symptomatic   8-19 moderately symptomatic   20-35 severely symptomatic  QOL score: 2  (below copied from [facility] records):  PCa 54 year old male presenting for follow-up of prostate cancer.  TRUS/Bx on2.23.2021. At that time, PSA was 7.9. Prostate volume 43 mL. PSAD 0.18. 4/12 cores revealed adenocarcinoma: 3 cores revealed GS 3+3 pattern-left base lateral, left base medial and left mid medial with 90%, 20% and 3% of core involved with cancer. 1 core revealed GS 3+4 pattern,.1 was located in the left apex lateral zone, 60% of core involved with cancer.  IPSS 5  7.8.2021: I-125  brachytherapy/SpaceOAR placement  8.3.2021: He does complain of mild dysuria and urinary frequency.  He does have some discomfort with ejaculation as well.  He has had some blood on his toilet tissue.  No blood in the toilet water, however.  IPSS 12, quality-of-life score 3.  He would like to consider medical therapy.    Past Medical History:  Diagnosis Date  . Acid reflux   . Arthritis   . Diabetes mellitus without complication ([place])   . Gout   . High cholesterol   . Prostate cancer ([place])   . Seasonal allergies   . Sleep apnea   . Wears glasses     Past Surgical History:  Procedure Laterality Date  . CARPAL TUNNEL RELEASE Left   . COLONOSCOPY    . HIATAL HERNIA REPAIR  2002?  . PROSTATE BIOPSY    . RADIOACTIVE SEED IMPLANT N/A 11/12/2019   Procedure: RADIOACTIVE SEED IMPLANT/BRACHYTHERAPY IMPLANT;  Surgeon: Franchot Gallo, MD;  Location: [facility];  Service: Urology;  Laterality: N/A;  71 seeds  . SPACE OAR INSTILLATION N/A 11/12/2019   Procedure: SPACE OAR INSTILLATION;  Surgeon: Franchot Gallo, MD;  Location: [facility];  Service: Urology;  Laterality: N/A;    Home Medications:  Allergies as of 02/09/2020      Reactions   Mixed Ragweed    Watery  eyes, sneezing      Medication List       Accurate as of February 09, 2020  9:06 AM. If you have any questions, ask your nurse or doctor.        alfuzosin 10 MG 24 hr tablet Commonly known as: UROXATRAL Take 1 tablet (10 mg total) by mouth daily with breakfast.   colchicine 0.6 MG tablet Take 0.6 mg by mouth as needed.   D3-50 1.25 MG (50000 UT) capsule Generic drug: Cholecalciferol Take 50,000 Units by mouth once a week.   fexofenadine 180 MG tablet Commonly known as: ALLEGRA Take 180 mg by mouth every morning.   gabapentin 300 MG capsule Commonly known as: NEURONTIN Take 300 mg by mouth every evening.   ibuprofen 800 MG tablet Commonly known as: ADVIL Take 800 mg by  mouth 3 (three) times daily as needed.   omeprazole 20 MG capsule Commonly known as: PRILOSEC Take 20 mg by mouth every morning.   omeprazole 40 MG capsule Commonly known as: PRILOSEC Take 40 mg by mouth daily.   psyllium 0.52 g capsule Commonly known as: REGULOID Take 0.52 g by mouth daily.   sildenafil 100 MG tablet Commonly known as: Viagra Take 1 tablet (100 mg total) by mouth as needed for erectile dysfunction.   triamcinolone cream 0.1 % Commonly known as: KENALOG 2 (two) times daily.   Trulicity 1.5 PN/3.6RW Sopn Generic drug: Dulaglutide Inject 1.5 mg into the skin once a week. Every Sunday       Allergies:  Allergies  Allergen Reactions  . Mixed Ragweed     Watery eyes, sneezing    Family History  Problem Relation Age of Onset  . Breast cancer Neg Hx   . Prostate cancer Neg Hx   . Colon cancer Neg Hx   . Pancreatic cancer Neg Hx     Social History:  reports that he has never smoked. He has never used smokeless tobacco. He reports current alcohol use. He reports that he does not use drugs.  ROS: A complete review of systems was performed.  All systems are negative except for pertinent findings as noted.  Physical Exam:  Vital signs in last 24 hours: There were no vitals taken for this visit. Constitutional:  Alert and oriented, No acute distress Cardiovascular: Regular rate  Respiratory: Normal respiratory effort Neurologic: Grossly intact, no focal deficits Psychiatric: Normal mood and affect  I have reviewed prior pt notes  I have reviewed notes from referring/previous physicians  I have reviewed urinalysis result  I have reviewed prior PSA/pathology results  Impression/Assessment:  1. Pt tolerated brachytherapy and is recovering well.  2.  Initial hematuria, most likely related to prostatic urethral origin  Plan:  1. Pt refilled for sildenafil.  2. Pt advised regarding lifestyle modifications to improve his cardiovascular fitness  and thus reduce his ED symptomatology: 20-30 minutes of cardio several times per week, reduce cholesterol, reduce fatty meat portions.  3. PSA drawn today.  4. F/U in 4 months for OV, PSA, and symptom recheck.  CC: Dr. Hardie Shackleton

## 2020-02-10 ENCOUNTER — Telehealth: Payer: Self-pay

## 2020-02-10 LAB — PSA: Prostate Specific Ag, Serum: 2.3 ng/mL (ref 0.0–4.0)

## 2020-02-10 NOTE — Telephone Encounter (Signed)
Pt notified. PSA down per Dr. Diona Fanti.

## 2020-02-10 NOTE — Telephone Encounter (Signed)
-----   Message from Franchot Gallo, MD sent at 02/10/2020 12:20 PM EDT ----- Notify patient that PSA is down to 2.3, great news. ----- Message ----- From: Iris Pert, LPN Sent: 72/12/2058   8:24 AM EDT To: Franchot Gallo, MD  Please review

## 2020-02-10 NOTE — Progress Notes (Signed)
Results mailed to  pt.

## 2020-04-04 ENCOUNTER — Other Ambulatory Visit: Payer: BC Managed Care – PPO

## 2020-04-12 ENCOUNTER — Ambulatory Visit: Payer: BC Managed Care – PPO | Admitting: Urology

## 2020-06-14 ENCOUNTER — Other Ambulatory Visit: Payer: Self-pay

## 2020-06-14 ENCOUNTER — Encounter: Payer: Self-pay | Admitting: Urology

## 2020-06-14 ENCOUNTER — Ambulatory Visit (INDEPENDENT_AMBULATORY_CARE_PROVIDER_SITE_OTHER): Payer: BC Managed Care – PPO | Admitting: Urology

## 2020-06-14 VITALS — BP 131/81 | HR 77 | Temp 98.0°F | Ht 75.0 in | Wt 270.0 lb

## 2020-06-14 DIAGNOSIS — N138 Other obstructive and reflux uropathy: Secondary | ICD-10-CM | POA: Diagnosis not present

## 2020-06-14 DIAGNOSIS — N521 Erectile dysfunction due to diseases classified elsewhere: Secondary | ICD-10-CM | POA: Diagnosis not present

## 2020-06-14 DIAGNOSIS — C61 Malignant neoplasm of prostate: Secondary | ICD-10-CM | POA: Diagnosis not present

## 2020-06-14 DIAGNOSIS — N401 Enlarged prostate with lower urinary tract symptoms: Secondary | ICD-10-CM

## 2020-06-14 LAB — URINALYSIS, ROUTINE W REFLEX MICROSCOPIC
Bilirubin, UA: NEGATIVE
Glucose, UA: NEGATIVE
Ketones, UA: NEGATIVE
Leukocytes,UA: NEGATIVE
Nitrite, UA: NEGATIVE
Protein,UA: NEGATIVE
RBC, UA: NEGATIVE
Specific Gravity, UA: 1.025 (ref 1.005–1.030)
Urobilinogen, Ur: 0.2 mg/dL (ref 0.2–1.0)
pH, UA: 5.5 (ref 5.0–7.5)

## 2020-06-14 NOTE — Progress Notes (Signed)
Urological Symptom Review  Patient is experiencing the following symptoms: Frequent urination Getting up at night  Review of Systems  Gastrointestinal (upper)  : Negative for upper GI symptoms  Gastrointestinal (lower) : Diarrhea Constipation  Constitutional : Fatigue  Skin: Skin rash/lesion Itching  Eyes: Negative for eye symptoms  Ear/Nose/Throat : Negative for Ear/Nose/Throat symptoms  Hematologic/Lymphatic: Negative for Hematologic/Lymphatic symptoms  Cardiovascular : Negative for cardiovascular symptoms  Respiratory : Negative for respiratory symptoms  Endocrine: Negative for endocrine symptoms  Musculoskeletal: Joint pain  Neurological: Negative for neurological symptoms  Psychologic: Negative for psychiatric symptoms

## 2020-06-14 NOTE — Progress Notes (Signed)
H&P  Chief Complaint: PCa  History of Present Illness:   TRUS/Bxon2.23.2021. At that time, PSA was 7.9. Prostate volume 43 mL. PSAD 0.18. 4/12 cores revealed adenocarcinoma: 3 cores revealed GS 3+3 pattern-left base lateral, left base medial and left mid medial with 90%, 20% and 3% of core involved with cancer. 1 core revealed GS 3+4 pattern,.1 was located in the left apex lateral zone, 60% of core involved with cancer. IPSS 5  7.8.2021:I-125 brachytherapy/SpaceOAR placement  8.3.2021:He does complain of mild dysuria and urinary frequency. He does have some discomfort with ejaculation as well. He has had some blood on his toilet tissue. No blood in the toilet water, however.  IPSS 12, quality-of-life score 3. He would like to consider medical therapy.  10.5.2021: PSA 2.3   2.8.2022: Josua is here today for F/U of his PCA, ED, and gross hematuria, He denies any dysuria or recurrence of gross hematuria. He continues on sildenafil & alfuzosin with adequate response to both. He has tolerated his brachytherapy well and reports no significant urologic symptoms at this time  IPSS Questionnaire (AUA-7): Over the past month.   1)  How often have you had a sensation of not emptying your bladder completely after you finish urinating?  0 - Not at all  2)  How often have you had to urinate again less than two hours after you finished urinating? 1 - Less than 1 time in 5  3)  How often have you found you stopped and started again several times when you urinated?  0 - Not at all  4) How difficult have you found it to postpone urination?  1 - Less than 1 time in 5  5) How often have you had a weak urinary stream?  1 - Less than 1 time in 5  6) How often have you had to push or strain to begin urination?  0 - Not at all  7) How many times did you most typically get up to urinate from the time you went to bed until the time you got up in the morning?  2 - 2 times  Total score:  0-7  mildly symptomatic   8-19 moderately symptomatic   20-35 severely symptomatic  IPSS: 5 QoL Score: 2       Past Medical History:  Diagnosis Date  . Acid reflux   . Arthritis   . Diabetes mellitus without complication ([place])   . Gout   . High cholesterol   . Prostate cancer ([place])   . Seasonal allergies   . Sleep apnea   . Wears glasses     Past Surgical History:  Procedure Laterality Date  . CARPAL TUNNEL RELEASE Left   . COLONOSCOPY    . HIATAL HERNIA REPAIR  2002?  . PROSTATE BIOPSY    . RADIOACTIVE SEED IMPLANT N/A 11/12/2019   Procedure: RADIOACTIVE SEED IMPLANT/BRACHYTHERAPY IMPLANT;  Surgeon: Franchot Gallo, MD;  Location: [facility];  Service: Urology;  Laterality: N/A;  71 seeds  . SPACE OAR INSTILLATION N/A 11/12/2019   Procedure: SPACE OAR INSTILLATION;  Surgeon: Franchot Gallo, MD;  Location: [facility];  Service: Urology;  Laterality: N/A;    Home Medications:  Allergies as of 06/14/2020      Reactions   Mixed Ragweed    Watery eyes, sneezing      Medication List       Accurate as of June 14, 2020  9:09 AM. If you have any questions, ask your  nurse or doctor.        alfuzosin 10 MG 24 hr tablet Commonly known as: UROXATRAL Take 1 tablet (10 mg total) by mouth daily with breakfast.   colchicine 0.6 MG tablet Take 0.6 mg by mouth as needed.   D3-50 1.25 MG (50000 UT) capsule Generic drug: Cholecalciferol Take 50,000 Units by mouth once a week.   fexofenadine 180 MG tablet Commonly known as: ALLEGRA Take 180 mg by mouth every morning.   gabapentin 300 MG capsule Commonly known as: NEURONTIN Take 300 mg by mouth every evening.   ibuprofen 800 MG tablet Commonly known as: ADVIL Take 800 mg by mouth 3 (three) times daily as needed.   omeprazole 20 MG capsule Commonly known as: PRILOSEC Take 20 mg by mouth every morning.   omeprazole 40 MG capsule Commonly known as: PRILOSEC Take 40 mg by mouth  daily.   psyllium 0.52 g capsule Commonly known as: REGULOID Take 0.52 g by mouth daily.   sildenafil 100 MG tablet Commonly known as: Viagra Take 1 tablet (100 mg total) by mouth as needed for erectile dysfunction.   sildenafil 100 MG tablet Commonly known as: VIAGRA Take 1 tablet (100 mg total) by mouth daily as needed for erectile dysfunction.   triamcinolone 0.1 % Commonly known as: KENALOG 2 (two) times daily.   Trulicity 1.55 YO/3.7CH Sopn Generic drug: Dulaglutide Inject 1.5 mg into the skin once a week. Every Sunday       Allergies:  Allergies  Allergen Reactions  . Mixed Ragweed     Watery eyes, sneezing    Family History  Problem Relation Age of Onset  . Breast cancer Neg Hx   . Prostate cancer Neg Hx   . Colon cancer Neg Hx   . Pancreatic cancer Neg Hx     Social History:  reports that he has never smoked. He has never used smokeless tobacco. He reports current alcohol use. He reports that he does not use drugs.  ROS: A complete review of systems was performed.  All systems are negative except for pertinent findings as noted.  Physical Exam:  Vital signs in last 24 hours: There were no vitals taken for this visit. Constitutional:  Alert and oriented, No acute distress Cardiovascular: Regular rate  Respiratory: Normal respiratory effort GI: Abdomen is soft, nontender, nondistended, no abdominal masses. No CVAT. No hernias. Genitourinary: Normal male phallus, testes are descended bilaterally and non-tender and without masses, scrotum is normal in appearance without lesions or masses, perineum is normal on inspection. Prostate is smooth and flat. Lymphatic: No lymphadenopathy Neurologic: Grossly intact, no focal deficits Psychiatric: Normal mood and affect  I have reviewed prior pt notes  I have reviewed notes from referring/previous physicians  I have reviewed urinalysis results  I have reviewed prior PSA/pathology  results    Impression/Assessment:  PCa: PSA taken in October shows an excellent response to his brachytherapy. PSA pending for today.  ED: Pt symptoms are stable and well-managed under sildenafil  Gross Hematuria: No recurrence noted between visits.  LUTS: He is stable with minimal LUTS on alfuzosin. He was not on any urologic medication prior to seed placement and he may not require any medication for LUTS at this time.  Plan:  1. Pt advised to attempt discontinuation of alfuzosin as is tolerable.  2. Pt continued on sildenafil.  3. F/U in 4 months for OV, PSA, DRE, and symptom recheck.  CC: Dr. Hardie Shackleton

## 2020-06-15 LAB — PSA: Prostate Specific Ag, Serum: 1.3 ng/mL (ref 0.0–4.0)

## 2020-06-17 ENCOUNTER — Telehealth: Payer: Self-pay

## 2020-06-17 NOTE — Telephone Encounter (Signed)
-----   Message from Franchot Gallo, MD sent at 06/17/2020  1:40 PM EST -----  Notify patient that PSA is lower at 1.3, good news ----- Message ----- From: Iris Pert, LPN Sent: 6/64/4034   2:16 PM EST To: Franchot Gallo, MD  Please review

## 2020-06-17 NOTE — Telephone Encounter (Signed)
Patient called no answer, message left to call office. Letter sent via mail with lab results.

## 2020-06-17 NOTE — Progress Notes (Signed)
Sent via mail 

## 2020-10-10 NOTE — Progress Notes (Signed)
History of Present Illness: Here for followup of PCa.  TRUS/Bxon2.23.2021. At that time, PSA was 7.9, prostate volume 43 mll,  PSAD 0.18. 4/12 cores positive for  adenocarcinoma: 3 cores revealed GS 3+3 pattern-left base lateral, left base medial and left mid medial with 90%, 20% and 3% of core involved with cancer. 1 core revealed GS 3+4 pattern,.1 was located in the left apex lateral zone, 60% of core involved with cancer. IPSS 5  7.8.2021:I-125 brachytherapy/SpaceOAR placement  10.5.2021:PSA 2.3   2.8.2022: PSA 1.3.  6.7.2022: No LUTS of mention.  He has not had a recent PSA.  Prior to his diagnosis of prostate cancer he was on testosterone repletion.  He has not been on any since that time.  He does have a low libido.  His sildenafil, now with 100 mg has not really helped his erections that much.  He does not necessarily take it on an empty stomach.  IPSS 7  QoL 3  Past Medical History:  Diagnosis Date  . Acid reflux   . Arthritis   . Diabetes mellitus without complication ([place])   . Gout   . High cholesterol   . Prostate cancer ([place])   . Seasonal allergies   . Sleep apnea   . Wears glasses     Past Surgical History:  Procedure Laterality Date  . CARPAL TUNNEL RELEASE Left   . COLONOSCOPY    . HIATAL HERNIA REPAIR  2002?  . PROSTATE BIOPSY    . RADIOACTIVE SEED IMPLANT N/A 11/12/2019   Procedure: RADIOACTIVE SEED IMPLANT/BRACHYTHERAPY IMPLANT;  Surgeon: Franchot Gallo, MD;  Location: [facility];  Service: Urology;  Laterality: N/A;  71 seeds  . SPACE OAR INSTILLATION N/A 11/12/2019   Procedure: SPACE OAR INSTILLATION;  Surgeon: Franchot Gallo, MD;  Location: [facility];  Service: Urology;  Laterality: N/A;    Home Medications:  Allergies as of 10/11/2020      Reactions   Mixed Ragweed    Watery eyes, sneezing      Medication List       Accurate as of October 10, 2020  8:30 PM. If you have any questions, ask your nurse or  doctor.        alfuzosin 10 MG 24 hr tablet Commonly known as: UROXATRAL Take 1 tablet (10 mg total) by mouth daily with breakfast.   colchicine 0.6 MG tablet Take 0.6 mg by mouth as needed.   D3-50 1.25 MG (50000 UT) capsule Generic drug: Cholecalciferol Take 50,000 Units by mouth once a week.   fexofenadine 180 MG tablet Commonly known as: ALLEGRA Take 180 mg by mouth every morning.   gabapentin 300 MG capsule Commonly known as: NEURONTIN Take 300 mg by mouth every evening.   ibuprofen 800 MG tablet Commonly known as: ADVIL Take 800 mg by mouth 3 (three) times daily as needed.   omeprazole 20 MG capsule Commonly known as: PRILOSEC Take 20 mg by mouth every morning.   omeprazole 40 MG capsule Commonly known as: PRILOSEC Take 40 mg by mouth daily.   psyllium 0.52 g capsule Commonly known as: REGULOID Take 0.52 g by mouth daily.   sildenafil 100 MG tablet Commonly known as: Viagra Take 1 tablet (100 mg total) by mouth as needed for erectile dysfunction.   sildenafil 100 MG tablet Commonly known as: VIAGRA Take 1 tablet (100 mg total) by mouth daily as needed for erectile dysfunction.   triamcinolone cream 0.1 % Commonly known as: KENALOG 2 (two)  times daily.   Trulicity 1.5 FB/5.1WC Sopn Generic drug: Dulaglutide Inject 1.5 mg into the skin once a week. Every Sunday       Allergies:  Allergies  Allergen Reactions  . Mixed Ragweed     Watery eyes, sneezing    Family History  Problem Relation Age of Onset  . Breast cancer Neg Hx   . Prostate cancer Neg Hx   . Colon cancer Neg Hx   . Pancreatic cancer Neg Hx     Social History:  reports that he has never smoked. He has never used smokeless tobacco. He reports current alcohol use. He reports that he does not use drugs.  ROS: A complete review of systems was performed.  All systems are negative except for pertinent findings as noted.  Physical Exam:  Vital signs in last 24 hours: There were no  vitals taken for this visit. Constitutional:  Alert and oriented, No acute distress Cardiovascular: Regular rate  Respiratory: Normal respiratory effort GI: Abdomen is soft, nontender, nondistended, no abdominal masses. No CVAT.  Genitourinary: Normal male phallus, testes are descended bilaterally and non-tender and without masses, scrotum is normal in appearance without lesions or masses, perineum is normal on inspection.  Rectal exam-prostate flat, smooth.  No nodularity.  No rectal masses. Lymphatic: No lymphadenopathy Neurologic: Grossly intact, no focal deficits Psychiatric: Normal mood and affect  I have reviewed prior pt notes  I have reviewed notes from referring/previous physicians  I have reviewed urinalysis results  I have reviewed prior PSA results  Impression/Assessment:  1.  History of prostate cancer, status post brachytherapy in July, 2021.  Up till this point, good PSA response.  PSA pending today  2.  History of low testosterone, not on repletion.  He has not had any recent testosterone levels  3.  ED, on sildenafil, 100 mg.  Not working that well.  Plan:  1.  I recommended that he take his sildenafil on an empty stomach, get exercise  2.  PSA will be checked today, as well as in 6 months.  Additionally, testosterone panel in 6 months  3.  I will see back in 6 months for recheck.

## 2020-10-11 ENCOUNTER — Ambulatory Visit (INDEPENDENT_AMBULATORY_CARE_PROVIDER_SITE_OTHER): Payer: BC Managed Care – PPO | Admitting: Urology

## 2020-10-11 ENCOUNTER — Encounter: Payer: Self-pay | Admitting: Urology

## 2020-10-11 ENCOUNTER — Other Ambulatory Visit: Payer: Self-pay

## 2020-10-11 VITALS — BP 130/83 | HR 71 | Ht 75.0 in | Wt 280.0 lb

## 2020-10-11 DIAGNOSIS — N401 Enlarged prostate with lower urinary tract symptoms: Secondary | ICD-10-CM | POA: Diagnosis not present

## 2020-10-11 DIAGNOSIS — N138 Other obstructive and reflux uropathy: Secondary | ICD-10-CM | POA: Diagnosis not present

## 2020-10-11 DIAGNOSIS — C61 Malignant neoplasm of prostate: Secondary | ICD-10-CM | POA: Diagnosis not present

## 2020-10-11 DIAGNOSIS — Z8546 Personal history of malignant neoplasm of prostate: Secondary | ICD-10-CM | POA: Diagnosis not present

## 2020-10-11 DIAGNOSIS — N521 Erectile dysfunction due to diseases classified elsewhere: Secondary | ICD-10-CM

## 2020-10-11 LAB — URINALYSIS, ROUTINE W REFLEX MICROSCOPIC
Bilirubin, UA: NEGATIVE
Glucose, UA: NEGATIVE
Ketones, UA: NEGATIVE
Leukocytes,UA: NEGATIVE
Nitrite, UA: NEGATIVE
Protein,UA: NEGATIVE
RBC, UA: NEGATIVE
Specific Gravity, UA: >1.03 — ABNORMAL HIGH (ref 1.005–1.030)
Urobilinogen, Ur: 0.2 mg/dL (ref 0.2–1.0)
pH, UA: 6.5 (ref 5.0–7.5)

## 2020-10-12 LAB — PSA: Prostate Specific Ag, Serum: 0.6 ng/mL (ref 0.0–4.0)

## 2020-10-14 NOTE — Progress Notes (Signed)
Letter sent.

## 2021-02-06 ENCOUNTER — Other Ambulatory Visit: Payer: Self-pay

## 2021-02-06 ENCOUNTER — Telehealth: Payer: Self-pay

## 2021-02-06 DIAGNOSIS — C61 Malignant neoplasm of prostate: Secondary | ICD-10-CM

## 2021-02-06 MED ORDER — ALFUZOSIN HCL ER 10 MG PO TB24: 10.0000 mg | ORAL_TABLET | Freq: Every day | ORAL | 11 refills | Status: DC

## 2021-02-06 NOTE — Telephone Encounter (Signed)
Alfuzosin rx refilled. Will have Dr. Diona Fanti look at [facility] paperwork. Patient voiced understanding.

## 2021-02-06 NOTE — Telephone Encounter (Signed)
Needing another refill on a medication Dr. Diona Fanti had prescribed to him.  He is almost out. Please fill at:  [id number] - [place], [place] Phone:  [phone]  Fax:  [phone]     Patient also dropped off FMLA paperwork to be completed.  Thanks, Helene Kelp

## 2021-04-04 ENCOUNTER — Other Ambulatory Visit: Payer: BC Managed Care – PPO

## 2021-04-11 ENCOUNTER — Ambulatory Visit: Payer: BC Managed Care – PPO | Admitting: Urology

## 2021-04-11 ENCOUNTER — Telehealth: Payer: Self-pay

## 2021-04-11 NOTE — Telephone Encounter (Signed)
Patient called with no answer regarding FMLA paperwork. Message left to return call to office.

## 2021-04-20 NOTE — Telephone Encounter (Signed)
Called patient. LM paperwork at front desk for pick up.

## 2021-04-20 NOTE — Telephone Encounter (Signed)
FMLA paperwork also faxed to [phone]

## 2021-04-24 ENCOUNTER — Other Ambulatory Visit: Payer: Self-pay

## 2021-04-24 ENCOUNTER — Other Ambulatory Visit: Payer: BC Managed Care – PPO

## 2021-04-24 DIAGNOSIS — R972 Elevated prostate specific antigen [PSA]: Secondary | ICD-10-CM

## 2021-04-24 DIAGNOSIS — N521 Erectile dysfunction due to diseases classified elsewhere: Secondary | ICD-10-CM

## 2021-04-24 DIAGNOSIS — Z8546 Personal history of malignant neoplasm of prostate: Secondary | ICD-10-CM

## 2021-04-25 LAB — PSA
Prostate Specific Ag, Serum: 0.6 ng/mL (ref 0.0–4.0)
Prostate Specific Ag, Serum: 0.6 ng/mL (ref 0.0–4.0)

## 2021-04-25 LAB — TESTOSTERONE: Testosterone: 228 ng/dL — ABNORMAL LOW (ref 264–916)

## 2021-05-01 NOTE — Progress Notes (Signed)
History of Present Illness: Marc Kim presents for followup of PCa--treated.  TRUS/Bx on 2.23.2021.  At that time, PSA was 7.9, prostate volume 43 mll,   PSAD 0.18.  4/12 cores positive for  adenocarcinoma: 3 cores revealed GS 3+3 pattern-left base lateral, left base medial and left mid medial with 90%, 20% and 3% of core involved with cancer. 1 core revealed GS 3+4 pattern,.1 was located in the left apex lateral zone, 60% of core involved with cancer. IPSS 5   7.8.2021: I-125 brachytherapy/SpaceOAR placement  12.27.2022: PSA 0.6. He is having more difficulty with the slow stream.  Nocturia x2-3.  He has no gross hematuria.  Occasional blood on his toilet tissue but not in the toilet water or stool.  He would like to consider getting back on testosterone repletion, but he is only 18 months out from his brachytherapy.  Past Medical History:  Diagnosis Date   Acid reflux    Arthritis    Diabetes mellitus without complication (HCC)    Gout    High cholesterol    Prostate cancer (HCC)    Seasonal allergies    Sleep apnea    Wears glasses     Past Surgical History:  Procedure Laterality Date   CARPAL TUNNEL RELEASE Left    COLONOSCOPY     HIATAL HERNIA REPAIR  2002?   PROSTATE BIOPSY     RADIOACTIVE SEED IMPLANT N/A 11/12/2019   Procedure: RADIOACTIVE SEED IMPLANT/BRACHYTHERAPY IMPLANT;  Surgeon: Franchot Gallo, MD;  Location: [facility];  Service: Urology;  Laterality: N/A;  71 seeds   SPACE OAR INSTILLATION N/A 11/12/2019   Procedure: SPACE OAR INSTILLATION;  Surgeon: Franchot Gallo, MD;  Location: [facility];  Service: Urology;  Laterality: N/A;    Home Medications:  Allergies as of 05/02/2021       Reactions   Mixed Ragweed    Watery eyes, sneezing        Medication List        Accurate as of May 01, 2021  4:56 PM. If you have any questions, ask your nurse or doctor.          alfuzosin 10 MG 24 hr tablet Commonly known  as: UROXATRAL Take 1 tablet (10 mg total) by mouth daily with breakfast.   colchicine 0.6 MG tablet Take 0.6 mg by mouth as needed.   D3-50 1.25 MG (50000 UT) capsule Generic drug: Cholecalciferol Take 50,000 Units by mouth once a week.   fexofenadine 180 MG tablet Commonly known as: ALLEGRA Take 180 mg by mouth every morning.   gabapentin 300 MG capsule Commonly known as: NEURONTIN Take 300 mg by mouth every evening.   ibuprofen 800 MG tablet Commonly known as: ADVIL Take 800 mg by mouth 3 (three) times daily as needed.   omeprazole 40 MG capsule Commonly known as: PRILOSEC Take 40 mg by mouth daily.   psyllium 0.52 g capsule Commonly known as: REGULOID Take 0.52 g by mouth daily.   sildenafil 100 MG tablet Commonly known as: Viagra Take 1 tablet (100 mg total) by mouth as needed for erectile dysfunction.   sildenafil 100 MG tablet Commonly known as: VIAGRA Take 1 tablet (100 mg total) by mouth daily as needed for erectile dysfunction.   triamcinolone cream 0.1 % Commonly known as: KENALOG 2 (two) times daily.   Trulicity 1.5 NT/6.1WE Sopn Generic drug: Dulaglutide Inject 1.5 mg into the skin once a week. Every Sunday  Allergies:  Allergies  Allergen Reactions   Mixed Ragweed     Watery eyes, sneezing    Family History  Problem Relation Age of Onset   Breast cancer Neg Hx    Prostate cancer Neg Hx    Colon cancer Neg Hx    Pancreatic cancer Neg Hx     Social History:  reports that he has never smoked. He has never used smokeless tobacco. He reports current alcohol use. He reports that he does not use drugs.  ROS: A complete review of systems was performed.  All systems are negative except for pertinent findings as noted.  Physical Exam:  Vital signs in last 24 hours: There were no vitals taken for this visit. Constitutional:  Alert and oriented, No acute distress Cardiovascular: Regular rate  Respiratory: Normal respiratory  effort Neurologic: Grossly intact, no focal deficits Psychiatric: Normal mood and affect  I have reviewed prior pt notes  I have reviewed urinalysis/PSA/pathology results     Impression/Assessment:  1.  History of prostate cancer, preoperative PSA 7.9, had brachytherapy in July 2021 with good PSA curve.  2.  BPH with symptoms, getting a bit worse  3.  History of hypogonadism, not currently on therapy  4.  History of ED, on sildenafil  Plan:  1.  I let him know that I do not want him back on repletion until at least 2 years following his brachytherapy  2.  I will see back in 6 months with testosterone and PSA.  If still low we will talk about self administration of testosterone cypionate at that time  3.  I will have him double up on his alfuzosin for now.  If this works well for him, he will call and I will change his prescription.

## 2021-05-02 ENCOUNTER — Encounter: Payer: Self-pay | Admitting: Urology

## 2021-05-02 ENCOUNTER — Ambulatory Visit (INDEPENDENT_AMBULATORY_CARE_PROVIDER_SITE_OTHER): Payer: BC Managed Care – PPO | Admitting: Urology

## 2021-05-02 ENCOUNTER — Other Ambulatory Visit: Payer: Self-pay

## 2021-05-02 DIAGNOSIS — R972 Elevated prostate specific antigen [PSA]: Secondary | ICD-10-CM | POA: Diagnosis not present

## 2021-05-02 DIAGNOSIS — C61 Malignant neoplasm of prostate: Secondary | ICD-10-CM | POA: Diagnosis not present

## 2021-05-02 DIAGNOSIS — Z8546 Personal history of malignant neoplasm of prostate: Secondary | ICD-10-CM | POA: Diagnosis not present

## 2021-05-02 DIAGNOSIS — N521 Erectile dysfunction due to diseases classified elsewhere: Secondary | ICD-10-CM

## 2021-05-02 LAB — URINALYSIS, ROUTINE W REFLEX MICROSCOPIC
Bilirubin, UA: NEGATIVE
Glucose, UA: NEGATIVE
Ketones, UA: NEGATIVE
Leukocytes,UA: NEGATIVE
Nitrite, UA: NEGATIVE
Protein,UA: NEGATIVE
RBC, UA: NEGATIVE
Specific Gravity, UA: 1.025 (ref 1.005–1.030)
Urobilinogen, Ur: 0.2 mg/dL (ref 0.2–1.0)
pH, UA: 5.5 (ref 5.0–7.5)

## 2021-05-02 MED ORDER — SILDENAFIL CITRATE 100 MG PO TABS: 100.0000 mg | ORAL_TABLET | ORAL | 11 refills | Status: DC | PRN

## 2021-05-02 NOTE — Progress Notes (Signed)
Urological Symptom Review  Patient is experiencing the following symptoms: Frequent urination Get up at night to urinate Stream starts and stops Weak stream   Review of Systems  Gastrointestinal (upper)  : Negative for upper GI symptoms  Gastrointestinal (lower) : Negative for lower GI symptoms  Constitutional : Night Sweats  Skin: Negative for skin symptoms  Eyes: Negative for eye symptoms  Ear/Nose/Throat : Negative for Ear/Nose/Throat symptoms  Hematologic/Lymphatic: Negative for Hematologic/Lymphatic symptoms  Cardiovascular : Negative for cardiovascular symptoms  Respiratory : Negative for respiratory symptoms  Endocrine: Negative for endocrine symptoms  Musculoskeletal: Negative for musculoskeletal symptoms  Neurological: Negative for neurological symptoms  Psychologic: Negative for psychiatric symptoms

## 2021-05-15 ENCOUNTER — Other Ambulatory Visit: Payer: Self-pay

## 2021-05-15 DIAGNOSIS — N521 Erectile dysfunction due to diseases classified elsewhere: Secondary | ICD-10-CM

## 2021-05-15 DIAGNOSIS — C61 Malignant neoplasm of prostate: Secondary | ICD-10-CM

## 2021-05-15 MED ORDER — SILDENAFIL CITRATE 100 MG PO TABS: 100.0000 mg | ORAL_TABLET | ORAL | 11 refills | Status: DC | PRN

## 2021-05-15 MED ORDER — ALFUZOSIN HCL ER 10 MG PO TB24: 10.0000 mg | ORAL_TABLET | Freq: Two times a day (BID) | ORAL | 11 refills | Status: DC

## 2021-06-01 ENCOUNTER — Other Ambulatory Visit: Payer: Self-pay

## 2021-06-01 DIAGNOSIS — N521 Erectile dysfunction due to diseases classified elsewhere: Secondary | ICD-10-CM

## 2021-06-01 MED ORDER — SILDENAFIL CITRATE 100 MG PO TABS: 100.0000 mg | ORAL_TABLET | ORAL | 11 refills | Status: DC | PRN

## 2021-10-30 NOTE — Progress Notes (Signed)
History of Present Illness:  Marc Kim presents for followup of PCa--treated.   TRUS/Bx on 2.23.2021.  At that time, PSA was 7.9, prostate volume 43 mll,   PSAD 0.18.  4/12 cores positive for  adenocarcinoma: 3 cores revealed GS 3+3 pattern-left base lateral, left base medial and left mid medial with 90%, 20% and 3% of core involved with cancer. 1 core revealed GS 3+4 pattern,.1 was located in the left apex lateral zone, 60% of core involved with cancer. IPSS 5   7.8.2021: I-125 brachytherapy/SpaceOAR placement   12.27.2022: PSA 0.6. He is having more difficulty with the slow stream.  Nocturia x2-3.  He has no gross hematuria.  Occasional blood on his toilet tissue but not in the toilet water or stool.  He would like to consider getting back on testosterone repletion, but he is only 18 months out from his brachytherapy.  6.27.2023: He is here for routine check. Has not had blood in urine or stool. IPSS 6  QoL score 2. On alfuzosin QD. No labs drawn.  Past Medical History:  Diagnosis Date   Acid reflux    Arthritis    Diabetes mellitus without complication (HCC)    Gout    High cholesterol    Prostate cancer (HCC)    Seasonal allergies    Sleep apnea    Wears glasses     Past Surgical History:  Procedure Laterality Date   CARPAL TUNNEL RELEASE Left    COLONOSCOPY     HIATAL HERNIA REPAIR  2002?   PROSTATE BIOPSY     RADIOACTIVE SEED IMPLANT N/A 11/12/2019   Procedure: RADIOACTIVE SEED IMPLANT/BRACHYTHERAPY IMPLANT;  Surgeon: Marcine Matar, MD;  Location: [facility];  Service: Urology;  Laterality: N/A;  71 seeds   SPACE OAR INSTILLATION N/A 11/12/2019   Procedure: SPACE OAR INSTILLATION;  Surgeon: Marcine Matar, MD;  Location: [facility];  Service: Urology;  Laterality: N/A;    Home Medications:  Allergies as of 10/31/2021       Reactions   Mixed Ragweed    Watery eyes, sneezing        Medication List        Accurate as of October 30, 2021  8:30 PM. If you have any questions, ask your nurse or doctor.          alfuzosin 10 MG 24 hr tablet Commonly known as: UROXATRAL Take 1 tablet (10 mg total) by mouth in the morning and at bedtime.   colchicine 0.6 MG tablet Take 0.6 mg by mouth as needed.   D3-50 1.25 MG (50000 UT) capsule Generic drug: Cholecalciferol Take 50,000 Units by mouth once a week.   fexofenadine 180 MG tablet Commonly known as: ALLEGRA Take 180 mg by mouth every morning.   gabapentin 300 MG capsule Commonly known as: NEURONTIN Take 300 mg by mouth every evening.   ibuprofen 800 MG tablet Commonly known as: ADVIL Take 800 mg by mouth 3 (three) times daily as needed.   omeprazole 40 MG capsule Commonly known as: PRILOSEC Take 40 mg by mouth daily.   psyllium 0.52 g capsule Commonly known as: REGULOID Take 0.52 g by mouth daily.   sildenafil 100 MG tablet Commonly known as: VIAGRA Take 1 tablet (100 mg total) by mouth daily as needed for erectile dysfunction.   sildenafil 100 MG tablet Commonly known as: Viagra Take 1 tablet (100 mg total) by mouth as needed for erectile dysfunction.   triamcinolone cream 0.1 %  Commonly known as: KENALOG 2 (two) times daily.   Trulicity 1.5 MG/0.5ML Sopn Generic drug: Dulaglutide Inject 1.5 mg into the skin once a week. Every Sunday        Allergies:  Allergies  Allergen Reactions   Mixed Ragweed     Watery eyes, sneezing    Family History  Problem Relation Age of Onset   Breast cancer Neg Hx    Prostate cancer Neg Hx    Colon cancer Neg Hx    Pancreatic cancer Neg Hx     Social History:  reports that he has never smoked. He has never used smokeless tobacco. He reports current alcohol use. He reports that he does not use drugs.  ROS: A complete review of systems was performed.  All systems are negative except for pertinent findings as noted.  Physical Exam:  Vital signs in last 24 hours: There were no vitals taken for  this visit. Constitutional:  Alert and oriented, No acute distress Cardiovascular: Regular rate  Respiratory: Normal respiratory effort GI: Abdomen is soft, nontender, nondistended, no abdominal masses. No CVAT.  No inguinal hernias. Genitourinary: Normal male phallus, testes are descended bilaterally and non-tender and without masses, scrotum is normal in appearance without lesions or masses, perineum is normal on inspection.  Sphincter tone normal.  Prostate smooth, flat, no nodularity. Lymphatic: No lymphadenopathy Neurologic: Grossly intact, no focal deficits Psychiatric: Normal mood and affect  I have reviewed prior pt notes  I have reviewed urinalysis results--clear  I have independently reviewed prior imaging  I have reviewed prior pathology/PSA results    Impression/Assessment:  1.  History of prostate cancer, status post I-125 brachytherapy in July 2021 without evidence of recurrence.  Last PSA low/stable.  2.  History of low testosterone level.  Off repletion for the past 2 years.  Symptomatic.  Plan:  1.  We will draw both testosterone profile as well as PSA today  2.  We will contact him with results especially the testosterone.  If still low we will teach him self administration of testosterone weekly  3.  Default appointment for 6 months following laboratories

## 2021-10-31 ENCOUNTER — Encounter: Payer: Self-pay | Admitting: Urology

## 2021-10-31 ENCOUNTER — Ambulatory Visit (INDEPENDENT_AMBULATORY_CARE_PROVIDER_SITE_OTHER): Payer: BC Managed Care – PPO | Admitting: Urology

## 2021-10-31 VITALS — BP 132/77 | HR 64

## 2021-10-31 DIAGNOSIS — N401 Enlarged prostate with lower urinary tract symptoms: Secondary | ICD-10-CM | POA: Diagnosis not present

## 2021-10-31 DIAGNOSIS — N138 Other obstructive and reflux uropathy: Secondary | ICD-10-CM

## 2021-10-31 DIAGNOSIS — N521 Erectile dysfunction due to diseases classified elsewhere: Secondary | ICD-10-CM | POA: Diagnosis not present

## 2021-10-31 DIAGNOSIS — Z8546 Personal history of malignant neoplasm of prostate: Secondary | ICD-10-CM

## 2021-10-31 DIAGNOSIS — R7989 Other specified abnormal findings of blood chemistry: Secondary | ICD-10-CM

## 2021-10-31 LAB — URINALYSIS, ROUTINE W REFLEX MICROSCOPIC
Bilirubin, UA: NEGATIVE
Glucose, UA: NEGATIVE
Ketones, UA: NEGATIVE
Leukocytes,UA: NEGATIVE
Nitrite, UA: NEGATIVE
Protein,UA: NEGATIVE
RBC, UA: NEGATIVE
Specific Gravity, UA: 1.02 (ref 1.005–1.030)
Urobilinogen, Ur: 0.2 mg/dL (ref 0.2–1.0)
pH, UA: 6.5 (ref 5.0–7.5)

## 2021-11-01 LAB — PSA: Prostate Specific Ag, Serum: 0.9 ng/mL (ref 0.0–4.0)

## 2021-11-13 NOTE — Progress Notes (Signed)
Letter sent.

## 2022-03-26 IMAGING — CR DG CHEST 2V
2 series · 2 of 2 positions shown · non-contrast
Comparison: None.

CLINICAL DATA: Preoperative exam for prostate seed implant.

EXAM:
CHEST - 2 VIEW

[w chest pa]
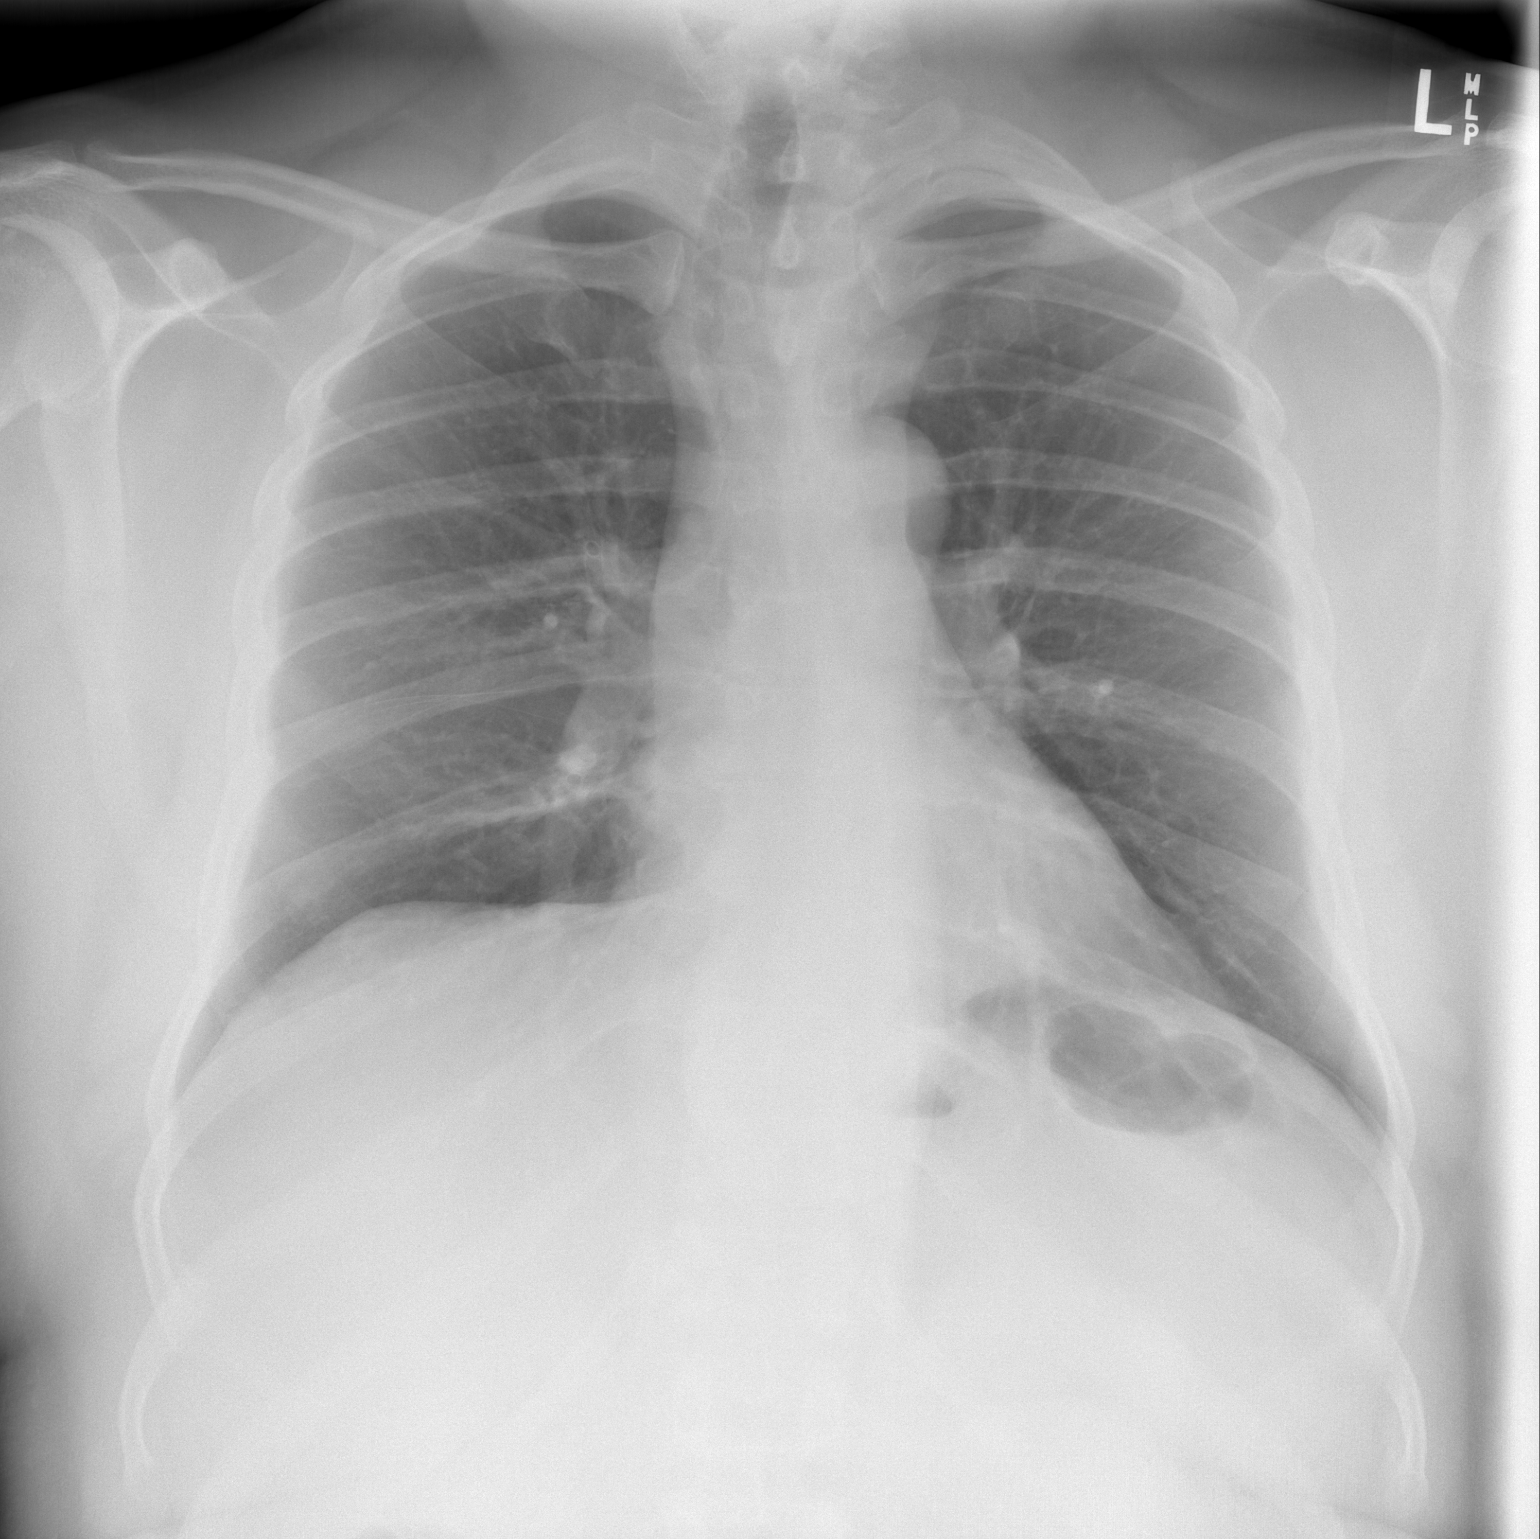

[w chest lat]
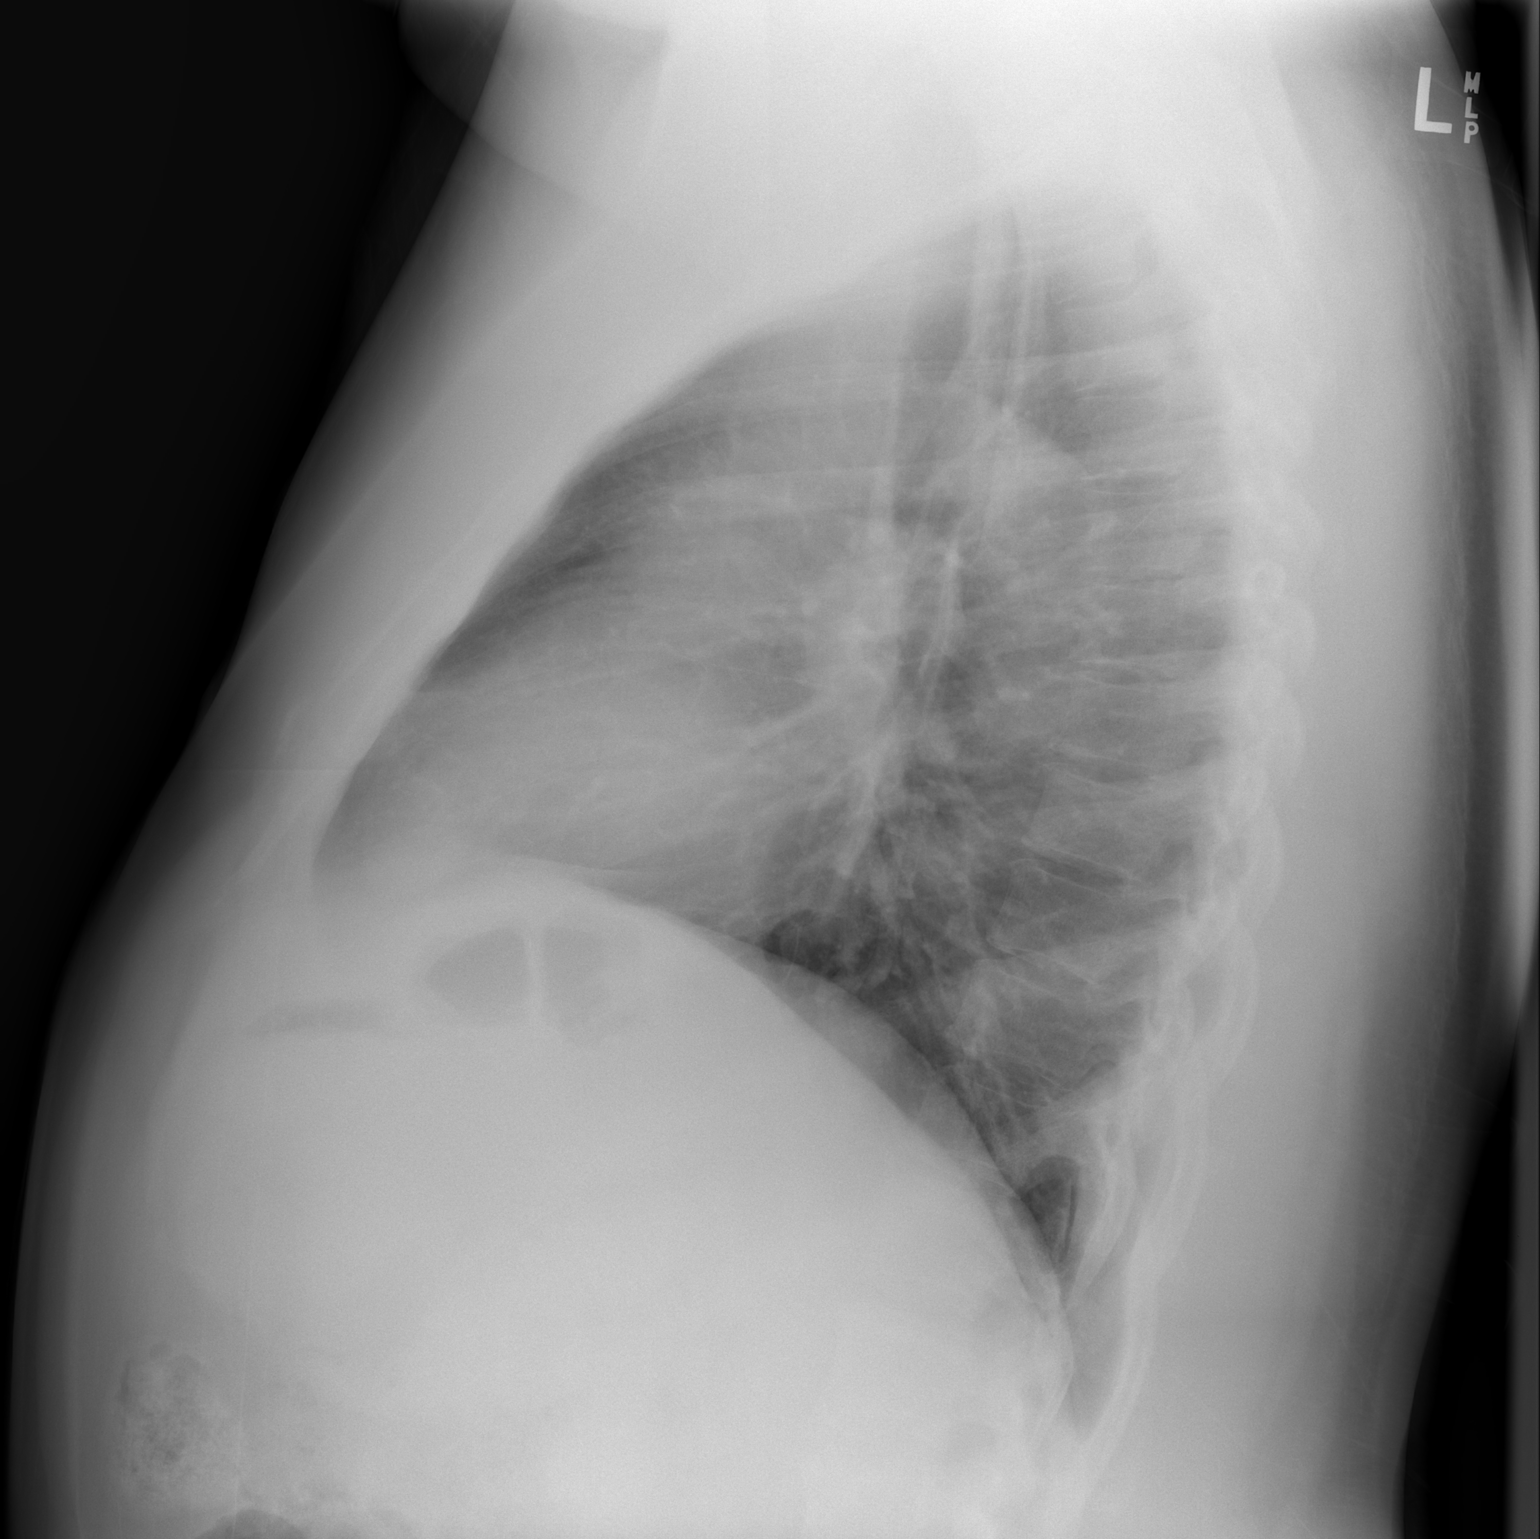

[2 of 2 positions shown; findings below may reference images not displayed]

FINDINGS: Heart and mediastinal shadows are normal. The lungs are clear.
Vascularity is normal. No effusions. No bone abnormality.
IMPRESSION: Normal chest

## 2022-04-19 ENCOUNTER — Telehealth: Payer: Self-pay

## 2022-04-19 NOTE — Telephone Encounter (Signed)
So he doesn't plan to come to his Tuesday appointment?

## 2022-04-19 NOTE — Telephone Encounter (Signed)
Patient wants to start taking the testosterone injections discussed at last visit with Dr. Diona Fanti  He has a Tues. Dec.19th appointment.  Can the medication be called in for him for the 19th appointment?  Patient only has Monday's off from work at this time.  Please advise.  Call back:  [phone] Jerilynn Mages)   Thanks, Helene Kelp

## 2022-04-20 NOTE — Telephone Encounter (Signed)
Please see below.

## 2022-04-23 ENCOUNTER — Other Ambulatory Visit: Payer: BC Managed Care – PPO

## 2022-04-23 DIAGNOSIS — Z8546 Personal history of malignant neoplasm of prostate: Secondary | ICD-10-CM

## 2022-04-24 ENCOUNTER — Encounter: Payer: Self-pay | Admitting: Urology

## 2022-04-24 ENCOUNTER — Ambulatory Visit (INDEPENDENT_AMBULATORY_CARE_PROVIDER_SITE_OTHER): Payer: BC Managed Care – PPO | Admitting: Urology

## 2022-04-24 VITALS — BP 134/77 | HR 65

## 2022-04-24 DIAGNOSIS — N138 Other obstructive and reflux uropathy: Secondary | ICD-10-CM

## 2022-04-24 DIAGNOSIS — N401 Enlarged prostate with lower urinary tract symptoms: Secondary | ICD-10-CM | POA: Diagnosis not present

## 2022-04-24 DIAGNOSIS — Z8546 Personal history of malignant neoplasm of prostate: Secondary | ICD-10-CM | POA: Diagnosis not present

## 2022-04-24 DIAGNOSIS — R3129 Other microscopic hematuria: Secondary | ICD-10-CM | POA: Diagnosis not present

## 2022-04-24 DIAGNOSIS — N521 Erectile dysfunction due to diseases classified elsewhere: Secondary | ICD-10-CM | POA: Diagnosis not present

## 2022-04-24 LAB — URINALYSIS, ROUTINE W REFLEX MICROSCOPIC
Bilirubin, UA: NEGATIVE
Glucose, UA: NEGATIVE
Ketones, UA: NEGATIVE
Leukocytes,UA: NEGATIVE
Nitrite, UA: NEGATIVE
Protein,UA: NEGATIVE
Specific Gravity, UA: 1.025 (ref 1.005–1.030)
Urobilinogen, Ur: 0.2 mg/dL (ref 0.2–1.0)
pH, UA: 5 (ref 5.0–7.5)

## 2022-04-24 LAB — MICROSCOPIC EXAMINATION: Bacteria, UA: NONE SEEN

## 2022-04-24 LAB — PSA: Prostate Specific Ag, Serum: 2.3 ng/mL (ref 0.0–4.0)

## 2022-04-24 LAB — TESTOSTERONE: Testosterone: 240 ng/dL — ABNORMAL LOW (ref 264–916)

## 2022-04-24 NOTE — Progress Notes (Signed)
History of Present Illness:    [name] presents for followup of PCa--treated.   TRUS/Bx on 2.23.2021.  At that time, PSA was 7.9, prostate volume 43 mll,   PSAD 0.18.  4/12 cores positive for  adenocarcinoma: 3 cores revealed GS 3+3 pattern-left base lateral, left base medial and left mid medial with 90%, 20% and 3% of core involved with cancer. 1 core revealed GS 3+4 pattern,.1 was located in the left apex lateral zone, 60% of core involved with cancer. IPSS 5   7.8.2021: I-125 brachytherapy/SpaceOAR  12.19.2023: PSA up to 2.3 (previous level 6 mos ago 0.9).  He has fairly stable lower urinary tract symptoms, on alfuzosin.  IPSS 6, quality-of-life score 2.  He has not had blood in his urine or stool.  Testosterone level was 240.  Last year at this time it was 228.  His ED is well-managed by sildenafil 100 mg.  Past Medical History:  Diagnosis Date   Acid reflux    Arthritis    Diabetes mellitus without complication (HCC)    Gout    High cholesterol    Prostate cancer (HCC)    Seasonal allergies    Sleep apnea    Wears glasses     Past Surgical History:  Procedure Laterality Date   CARPAL TUNNEL RELEASE Left    COLONOSCOPY     HIATAL HERNIA REPAIR  2002?   PROSTATE BIOPSY     RADIOACTIVE SEED IMPLANT N/A 11/12/2019   Procedure: RADIOACTIVE SEED IMPLANT/BRACHYTHERAPY IMPLANT;  Surgeon: Franchot Gallo, MD;  Location: [facility];  Service: Urology;  Laterality: N/A;  71 seeds   SPACE OAR INSTILLATION N/A 11/12/2019   Procedure: SPACE OAR INSTILLATION;  Surgeon: Franchot Gallo, MD;  Location: [facility];  Service: Urology;  Laterality: N/A;    Home Medications:  Allergies as of 04/24/2022       Reactions   Mixed Ragweed    Watery eyes, sneezing        Medication List        Accurate as of April 24, 2022  8:09 AM. If you have any questions, ask your nurse or doctor.          alfuzosin 10 MG 24 hr tablet Commonly known  as: UROXATRAL Take 1 tablet (10 mg total) by mouth in the morning and at bedtime.   colchicine 0.6 MG tablet Take 0.6 mg by mouth as needed.   D3-50 1.25 MG (50000 UT) capsule Generic drug: Cholecalciferol Take 50,000 Units by mouth once a week.   fexofenadine 180 MG tablet Commonly known as: ALLEGRA Take 180 mg by mouth every morning.   gabapentin 300 MG capsule Commonly known as: NEURONTIN Take 300 mg by mouth every evening.   ibuprofen 800 MG tablet Commonly known as: ADVIL Take 800 mg by mouth 3 (three) times daily as needed.   omeprazole 40 MG capsule Commonly known as: PRILOSEC Take 40 mg by mouth daily.   psyllium 0.52 g capsule Commonly known as: REGULOID Take 0.52 g by mouth daily.   sildenafil 100 MG tablet Commonly known as: VIAGRA Take 1 tablet (100 mg total) by mouth daily as needed for erectile dysfunction.   sildenafil 100 MG tablet Commonly known as: Viagra Take 1 tablet (100 mg total) by mouth as needed for erectile dysfunction.   triamcinolone cream 0.1 % Commonly known as: KENALOG 2 (two) times daily.   Trulicity 1.5 YY/5.0PT Sopn Generic drug: Dulaglutide Inject 1.5 mg into the skin  once a week. Every Sunday        Allergies:  Allergies  Allergen Reactions   Mixed Ragweed     Watery eyes, sneezing    Family History  Problem Relation Age of Onset   Breast cancer Neg Hx    Prostate cancer Neg Hx    Colon cancer Neg Hx    Pancreatic cancer Neg Hx     Social History:  reports that he has never smoked. He has never used smokeless tobacco. He reports current alcohol use. He reports that he does not use drugs.  ROS: A complete review of systems was performed.  All systems are negative except for pertinent findings as noted.  Physical Exam:  Vital signs in last 24 hours: There were no vitals taken for this visit. Constitutional:  Alert and oriented, No acute distress Cardiovascular: Regular rate  Respiratory: Normal respiratory  effor  Genitourinary: Normal anal sphincter tone.  Prostate nonpalpable.  No nodularity. Lymphatic: No lymphadenopathy Neurologic: Grossly intact, no focal deficits Psychiatric: Normal mood and affect  I have reviewed prior pt notes  I have reviewed notes from referring/previous physicians  I have reviewed urinalysis results-3-10 red cells seen per high-powered field  I have independently reviewed prior imaging-prostate ultrasound  I have reviewed prior PSA and testosterone results     Impression/Assessment:  1.  Grade group 2 prostate cancer, 2-1/2 years out from brachytherapy.  [place] PSA last year 0.6, PSA up to 2.3 now.  2.  ED, on sildenafil  3.  BPH, doing fairly well on alfuzosin  4.  Low testosterone level-recent level 240.  Not on repletion  5.  Microscopic hematuria based on urinalysis today  Plan:  1.  I will have him come back in 1 month to check his urine as well as PSA  2.  I will hold off on any testosterone repletion at this time until we work out his PSA data  3.  If continued upward trend of PSA, I will have PSMA PET scan scheduled  4.  Continue alfuzosin

## 2022-05-21 ENCOUNTER — Other Ambulatory Visit: Payer: Self-pay

## 2022-05-21 ENCOUNTER — Other Ambulatory Visit: Payer: BC Managed Care – PPO

## 2022-05-21 DIAGNOSIS — Z8546 Personal history of malignant neoplasm of prostate: Secondary | ICD-10-CM

## 2022-05-22 ENCOUNTER — Other Ambulatory Visit: Payer: Self-pay | Admitting: Urology

## 2022-05-22 DIAGNOSIS — R9721 Rising PSA following treatment for malignant neoplasm of prostate: Secondary | ICD-10-CM

## 2022-05-22 LAB — PSA: Prostate Specific Ag, Serum: 1.7 ng/mL (ref 0.0–4.0)

## 2022-05-23 ENCOUNTER — Other Ambulatory Visit: Payer: Self-pay | Admitting: Urology

## 2022-05-23 DIAGNOSIS — C61 Malignant neoplasm of prostate: Secondary | ICD-10-CM

## 2022-05-31 ENCOUNTER — Other Ambulatory Visit: Payer: Self-pay | Admitting: Urology

## 2022-05-31 ENCOUNTER — Telehealth: Payer: Self-pay

## 2022-05-31 DIAGNOSIS — Z8546 Personal history of malignant neoplasm of prostate: Secondary | ICD-10-CM

## 2022-05-31 NOTE — Telephone Encounter (Signed)
Tried calling patient with no answer, Left VM for return call

## 2022-05-31 NOTE — Telephone Encounter (Signed)
-----  Message from Franchot Gallo, MD sent at 05/31/2022  1:40 PM EST ----- Call patient-good news, PSA a bit lower at 1.7.  Continue to stay off of testosterone.  I would like to see him 3 months after another PSA.  I put that PSA order in. ----- Message ----- From: Sherrilyn Rist, CMA Sent: 05/22/2022   5:20 PM EST To: Franchot Gallo, MD  Please review

## 2022-06-05 NOTE — Telephone Encounter (Signed)
-----  Message from Franchot Gallo, MD sent at 05/31/2022  1:40 PM EST ----- Call patient-good news, PSA a bit lower at 1.7.  Continue to stay off of testosterone.  I would like to see him 3 months after another PSA.  I put that PSA order in. ----- Message ----- From: Sherrilyn Rist, CMA Sent: 05/22/2022   5:20 PM EST To: Franchot Gallo, MD  Please review

## 2022-06-05 NOTE — Telephone Encounter (Signed)
Made patient aware that his PSA is a bit lower  at 1.7 and continue to stay off testosterone and Dr. Diona Fanti will like to see him back in 3 months after PSA. Patient voiced understanding

## 2022-09-03 ENCOUNTER — Other Ambulatory Visit: Payer: BC Managed Care – PPO

## 2022-09-03 DIAGNOSIS — Z8546 Personal history of malignant neoplasm of prostate: Secondary | ICD-10-CM

## 2022-09-04 LAB — PSA: Prostate Specific Ag, Serum: 1.6 ng/mL (ref 0.0–4.0)

## 2022-09-09 NOTE — Progress Notes (Signed)
History of Present Illness:   Marc Kim presents for followup of PCa--treated.   TRUS/Bx on 2.23.2021.  At that time, PSA was 7.9, prostate volume 43 mll,   PSAD 0.18.  4/12 cores positive for  adenocarcinoma: 3 cores revealed GS 3+3 pattern-left base lateral, left base medial and left mid medial with 90%, 20% and 3% of core involved with cancer. 1 core revealed GS 3+4 pattern,.1 was located in the left apex lateral zone, 60% of core involved with cancer. IPSS 5   7.8.2021: I-125 brachytherapy/SpaceOAR   12.19.2023: PSA up to 2.3 (previous level 6 mos ago 0.9). Testosterone level was 240.  Last year at this time it was 228.   His ED is well-managed by sildenafil 100 mg.  5.7.2024: PSA 1.6. Microhematuria noted on u/u in Dec 2023.  Last testosterone was at the end of 2023 and was 240.  He has lost a little bit of weight.  He has not been on repletion.  Past Medical History:  Diagnosis Date   Acid reflux    Arthritis    Diabetes mellitus without complication (HCC)    Gout    High cholesterol    Prostate cancer (HCC)    Seasonal allergies    Sleep apnea    Wears glasses     Past Surgical History:  Procedure Laterality Date   CARPAL TUNNEL RELEASE Left    COLONOSCOPY     HIATAL HERNIA REPAIR  2002?   PROSTATE BIOPSY     RADIOACTIVE SEED IMPLANT N/A 11/12/2019   Procedure: RADIOACTIVE SEED IMPLANT/BRACHYTHERAPY IMPLANT;  Surgeon: Marcine Matar, MD;  Location[facility];  Service: Urology;  Laterality: N/A;  71 seeds   SPACE OAR INSTILLATION N/A 11/12/2019   Procedure: SPACE OAR INSTILLATION;  Surgeon: Marcine Matar, MD;  Location: [facility];  Service: Urology;  Laterality: N/A;    Home Medications:  Allergies as of 09/11/2022       Reactions   Mixed Ragweed    Watery eyes, sneezing        Medication List        Accurate as of Sep 09, 2022  4:57 PM. If you have any questions, ask your nurse or doctor.          alfuzosin 10  MG 24 hr tablet Commonly known as: UROXATRAL TAKE 1 TABLET (10 MG TOTAL) BY MOUTH IN THE MORNING AND AT BEDTIME   colchicine 0.6 MG tablet Take 0.6 mg by mouth as needed.   D3-50 1.25 MG (50000 UT) capsule Generic drug: Cholecalciferol Take 50,000 Units by mouth once a week.   fexofenadine 180 MG tablet Commonly known as: ALLEGRA Take 180 mg by mouth every morning.   gabapentin 300 MG capsule Commonly known as: NEURONTIN Take 300 mg by mouth every evening.   ibuprofen 800 MG tablet Commonly known as: ADVIL Take 800 mg by mouth 3 (three) times daily as needed.   omeprazole 40 MG capsule Commonly known as: PRILOSEC Take 40 mg by mouth daily.   OZEMPIC (0.25 OR 0.5 MG/DOSE) [place] Inject into the skin.   psyllium 0.52 g capsule Commonly known as: REGULOID Take 0.52 g by mouth daily.   sildenafil 100 MG tablet Commonly known as: VIAGRA Take 1 tablet (100 mg total) by mouth daily as needed for erectile dysfunction.   sildenafil 100 MG tablet Commonly known as: Viagra Take 1 tablet (100 mg total) by mouth as needed for erectile dysfunction.   triamcinolone cream 0.1 %  Commonly known as: KENALOG 2 (two) times daily.        Allergies:  Allergies  Allergen Reactions   Mixed Ragweed     Watery eyes, sneezing    Family History  Problem Relation Age of Onset   Breast cancer Neg Hx    Prostate cancer Neg Hx    Colon cancer Neg Hx    Pancreatic cancer Neg Hx     Social History:  reports that he has never smoked. He has never used smokeless tobacco. He reports current alcohol use. He reports that he does not use drugs.  ROS: A complete review of systems was performed.  All systems are negative except for pertinent findings as noted.  Physical Exam:  Vital signs in last 24 hours: There were no vitals taken for this visit. Constitutional:  Alert and oriented, No acute distress Cardiovascular: Regular rate  Respiratory: Normal respiratory effort Neurologic:  Grossly intact, no focal deficits Psychiatric: Normal mood and affect  I have reviewed prior pt notes  I have reviewed urinalysis results-urinalysis clear today.  I have independently reviewed prior imaging  I have reviewed prior PSA and pathology results  I have reviewed prior results   Impression/Assessment:  1.  Microscopic hematuria, cleared  2.  Grade group 2 prostate cancer, status post brachytherapy.  He did have a bump in his PSA at the end of last year but he has a downward trend now  3.  Slightly low testosterone level, not on repletion  Plan:  1.  Lose weight, exercise-this may help testosterone level  2.  I will see back in 3 months following testosterone panel as well as PSA

## 2022-09-11 ENCOUNTER — Encounter: Payer: Self-pay | Admitting: Urology

## 2022-09-11 ENCOUNTER — Ambulatory Visit: Payer: BC Managed Care – PPO | Admitting: Urology

## 2022-09-11 VITALS — BP 121/74 | HR 76 | Ht 75.0 in | Wt 280.0 lb

## 2022-09-11 DIAGNOSIS — Z8546 Personal history of malignant neoplasm of prostate: Secondary | ICD-10-CM

## 2022-09-11 DIAGNOSIS — R9721 Rising PSA following treatment for malignant neoplasm of prostate: Secondary | ICD-10-CM

## 2022-09-11 DIAGNOSIS — E291 Testicular hypofunction: Secondary | ICD-10-CM

## 2022-09-11 DIAGNOSIS — R7989 Other specified abnormal findings of blood chemistry: Secondary | ICD-10-CM

## 2022-09-11 DIAGNOSIS — R3129 Other microscopic hematuria: Secondary | ICD-10-CM

## 2022-09-12 LAB — URINALYSIS, ROUTINE W REFLEX MICROSCOPIC
Bilirubin, UA: NEGATIVE
Ketones, UA: NEGATIVE
Leukocytes,UA: NEGATIVE
Nitrite, UA: NEGATIVE
Protein,UA: NEGATIVE
RBC, UA: NEGATIVE
Specific Gravity, UA: 1.015 (ref 1.005–1.030)
Urobilinogen, Ur: 0.2 mg/dL (ref 0.2–1.0)
pH, UA: 5 (ref 5.0–7.5)

## 2022-11-30 NOTE — Progress Notes (Signed)
Cardiology Office Note:  .    Date:  12/03/2022  ID:  Marc Kim, DOB 21-May-1965, MRN [id number] PCP: Ardyth Man, MD   [facility]Care Providers Cardiologist:  Jodelle Red, MD     History of Present Illness: .    Marc Kim is a 57 y.o. male with a hx of hyperlipidemia, prediabetes, prostate cancer, here for the evaluation of hyperlipidemia.  Referral notes from Dr. Willaim Bane personally reviewed. As of 08/2022 lipid panel showed total cholesterol 270, triglycerides 143, HDL 49, LDL 195. Possible familial hypercholesterolemia. Hemoglobin A1c was 6.1.  Personal history of prostate cancer. He has been working with Dr. Retta Diones in urology.   Cardiovascular risk factors: Prior clinical ASCVD: None. Comorbid conditions:  Hyperlipidemia - Diagnosed about 10 years ago. Has tried medications before, including pravastatin 20 mg, but he still developed myalgias within 2 days particularly noting that he was unable to raise his right arm. He confirms having prediabetes - On farxiga (since 2 months ago) and Ozempic. Metabolic syndrome/Obesity:  Highest adult weight 285 lbs. Current weight 265 lbs. Chronic inflammatory conditions:  He confirms having a history of gout, associated with LE edema.  Tobacco use history:  Never. 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 His father had athletic heartbeat and eventually needed a pacemaker at 57 yo, died at age 19; prior sudden syncopal event. His mother recently had a stroke 3 months ago, 57 yo, now doing better. He has siblings. His younger brother is prediabetic. Prior pertinent testing and/or incidental findings: As of 08/2022 lipid panel showed total cholesterol 270, triglycerides 143, HDL 49, LDL 195. Exercise level: Lately not much formal exercise due to his work schedule. Most strenuous activity is at work with heavy lifting/loading up to 75-80 lbs. He denies significant limitations  at work. However, he does reports some recent lightheadedness with using push mower/weed eating on steeper areas of the lawn. Typically achieves 6500+ steps a day. Current diet:  Oatmeal in the mornings. Prepares meals for work. [place] food, spaghetti, tacos on the weekends. Fixes omelettes Sunday mornings.  He denies any palpitations, chest pain, shortness of breath, headaches, syncope, orthopnea, or PND.  ROS:  Please see the history of present illness. ROS otherwise negative except as noted.  (+) Lightheadedness (+) Occasional LE swelling associated with gout flares  Studies Reviewed: Marland Kitchen       ECG not ordered today  Physical Exam:    VS:  BP 132/86 (BP Location: Right Arm, Patient Position: Sitting, Cuff Size: Large)   Pulse (!) 56   Ht 6\' 3"  (1.905 m)   Wt 265 lb (120.2 kg)   SpO2 97%   BMI 33.12 kg/m    Wt Readings from Last 3 Encounters:  12/03/22 265 lb (120.2 kg)  09/11/22 280 lb (127 kg)  10/11/20 280 lb (127 kg)    GEN: Well nourished, well developed in no acute distress HEENT: Normal, moist mucous membranes NECK: No JVD CARDIAC: regular rhythm, normal S1 and S2, no rubs or gallops. No murmur. VASCULAR: Radial and DP pulses 2+ bilaterally. No carotid bruits RESPIRATORY:  Clear to auscultation without rales, wheezing or rhonchi  ABDOMEN: Soft, non-tender, non-distended MUSCULOSKELETAL:  Ambulates independently SKIN: Warm and dry, no edema NEUROLOGIC:  Alert and oriented x 3. No focal neuro deficits noted. PSYCHIATRIC:  Normal affect   ASSESSMENT AND PLAN: .    Hypercholesterolemia, LDL 195, consistent with heterozygous familial hypercholesterolemia -Agrees to  add 5 mg rosuvastatin. 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 Discussed this today -Discussed genetic testing for him, screening family members.  CV risk counseling and prevention -recommend heart healthy/Mediterranean diet, with whole grains, fruits, vegetable, fish, lean meats, nuts, and olive oil. Limit salt. -recommend moderate walking, 3-5 times/week for 30-50 minutes each session. Aim for at least 150 minutes.week. Goal should be pace of 3 miles/hours, or walking 1.5 miles in 30 minutes -recommend avoidance of tobacco products. Avoid excess alcohol.  Dispo: Follow-up in 6 months, or sooner as needed.  I,Mathew Stumpf,acting as a Neurosurgeon for [employer], MD.,have documented all relevant documentation on the behalf of Jodelle Red, MD,as directed by  Jodelle Red, MD while in the presence of Jodelle Red, MD.  I, Jodelle Red, MD, have reviewed all documentation for this visit. The documentation on 12/03/22 for the exam, diagnosis, procedures, and orders are all accurate and complete.   Signed, Jodelle Red, MD

## 2022-12-03 ENCOUNTER — Encounter (HOSPITAL_BASED_OUTPATIENT_CLINIC_OR_DEPARTMENT_OTHER): Payer: Self-pay | Admitting: Cardiology

## 2022-12-03 ENCOUNTER — Ambulatory Visit (HOSPITAL_BASED_OUTPATIENT_CLINIC_OR_DEPARTMENT_OTHER): Payer: BC Managed Care – PPO | Admitting: Cardiology

## 2022-12-03 VITALS — BP 132/86 | HR 56 | Ht 75.0 in | Wt 265.0 lb

## 2022-12-03 DIAGNOSIS — Z7189 Other specified counseling: Secondary | ICD-10-CM | POA: Diagnosis not present

## 2022-12-03 DIAGNOSIS — Z712 Person consulting for explanation of examination or test findings: Secondary | ICD-10-CM

## 2022-12-03 DIAGNOSIS — T466X5A Adverse effect of antihyperlipidemic and antiarteriosclerotic drugs, initial encounter: Secondary | ICD-10-CM

## 2022-12-03 DIAGNOSIS — E7801 Familial hypercholesterolemia: Secondary | ICD-10-CM

## 2022-12-03 DIAGNOSIS — R7303 Prediabetes: Secondary | ICD-10-CM

## 2022-12-03 DIAGNOSIS — G720 Drug-induced myopathy: Secondary | ICD-10-CM | POA: Diagnosis not present

## 2022-12-03 MED ORDER — ROSUVASTATIN CALCIUM 5 MG PO TABS
5.0000 mg | ORAL_TABLET | Freq: Every day | ORAL | 11 refills | Status: DC
Start: 2022-12-03 — End: 2023-01-29

## 2022-12-03 NOTE — Patient Instructions (Addendum)
Medication Instructions:  START ROSUVASTATIN 5 MG DAILY    *If you need a refill on your cardiac medications before your next appointment, please call your pharmacy*  Lab Work: NONE  Testing/Procedures: NONE  Follow-Up: At [facility], you and your health needs are our priority.  As part of our continuing mission to provide you with exceptional heart care, we have created designated Provider Care Teams.  These Care Teams include your primary Cardiologist (physician) and Advanced Practice Providers (APPs -  Physician Assistants and Nurse Practitioners) who all work together to provide you with the care you need, when you need it.  We recommend signing up for the patient portal called "MyChart".  Sign up information is provided on this After Visit Summary.  MyChart is used to connect with patients for Virtual Visits (Telemedicine).  Patients are able to view lab/test results, encounter notes, upcoming appointments, etc.  Non-urgent messages can be sent to your provider as well.   To learn more about what you can do with MyChart, go to [email or web address].    Your next appointment:   6 month(s)  The format for your next appointment:   In Person  Provider:   Jodelle Red, MD      In a few weeks, send [place] a message or call us and let us know how you are doing with the rosuvastatin. If you are tolerating it well, we will go up on the dose slowly. If you are not tolerating it, we will get you in to see one of our PharmDs to discuss injectable medication.

## 2022-12-16 ENCOUNTER — Encounter (HOSPITAL_BASED_OUTPATIENT_CLINIC_OR_DEPARTMENT_OTHER): Payer: Self-pay

## 2022-12-24 ENCOUNTER — Other Ambulatory Visit: Payer: BC Managed Care – PPO

## 2022-12-24 DIAGNOSIS — R7989 Other specified abnormal findings of blood chemistry: Secondary | ICD-10-CM

## 2022-12-24 DIAGNOSIS — Z8546 Personal history of malignant neoplasm of prostate: Secondary | ICD-10-CM

## 2022-12-25 LAB — PSA: Prostate Specific Ag, Serum: 1.5 ng/mL (ref 0.0–4.0)

## 2022-12-26 LAB — TESTOSTERONE,FREE AND TOTAL
Testosterone, Free: 1.9 pg/mL — ABNORMAL LOW (ref 7.2–24.0)
Testosterone: 213 ng/dL — ABNORMAL LOW (ref 264–916)

## 2022-12-31 NOTE — Progress Notes (Signed)
History of Present Illness:   Marc Kim presents for followup of PCa--treated.   TRUS/Bx on 2.23.2021.  At that time, PSA was 7.9, prostate volume 43 mll,   PSAD 0.18.  4/12 cores positive for  adenocarcinoma: 3 cores revealed GS 3+3 pattern-left base lateral, left base medial and left mid medial with 90%, 20% and 3% of core involved with cancer. 1 core revealed GS 3+4 pattern,.1 was located in the left apex lateral zone, 60% of core involved with cancer. IPSS 5   7.8.2021: I-125 brachytherapy/SpaceOAR   12.19.2023: PSA up to 2.3 (previous level 6 mos ago 0.9). Testosterone level was 240.  Last year at this time it was 228.   His ED is well-managed by sildenafil 100 mg.  5.7.2024: PSA 1.6  8.27.2024: PSA 1.5, total/free T--213,1.9.  No real urinary complaints.  IPSS 6/1.  He has not noted blood in his urine or stool.  He does wonder if he could take Cialis for his lower urinary tract symptoms treatment as well as ED.  Past Medical History:  Diagnosis Date   Acid reflux    Arthritis    Diabetes mellitus without complication (HCC)    Gout    High cholesterol    Prostate cancer (HCC)    Seasonal allergies    Sleep apnea    Wears glasses     Past Surgical History:  Procedure Laterality Date   CARPAL TUNNEL RELEASE Left    COLONOSCOPY     HIATAL HERNIA REPAIR  2002?   PROSTATE BIOPSY     RADIOACTIVE SEED IMPLANT N/A 11/12/2019   Procedure: RADIOACTIVE SEED IMPLANT/BRACHYTHERAPY IMPLANT;  Surgeon: Marcine Matar, MD;  Location: [facility];  Service: Urology;  Laterality: N/A;  71 seeds   SPACE OAR INSTILLATION N/A 11/12/2019   Procedure: SPACE OAR INSTILLATION;  Surgeon: Marcine Matar, MD;  Location: [facility];  Service: Urology;  Laterality: N/A;    Home Medications:  Allergies as of 01/01/2023       Reactions   Mixed Ragweed    Watery eyes, sneezing        Medication List        Accurate as of December 31, 2022  2:20 PM. If you  have any questions, ask your nurse or doctor.          alfuzosin 10 MG 24 hr tablet Commonly known as: UROXATRAL TAKE 1 TABLET (10 MG TOTAL) BY MOUTH IN THE MORNING AND AT BEDTIME   colchicine 0.6 MG tablet Take 0.6 mg by mouth as needed.   D3-50 1.25 MG (50000 UT) capsule Generic drug: Cholecalciferol Take 50,000 Units by mouth once a week.   Farxiga 10 MG Tabs tablet Generic drug: dapagliflozin propanediol Take 10 mg by mouth daily.   fexofenadine 180 MG tablet Commonly known as: ALLEGRA Take 180 mg by mouth every morning.   ibuprofen 800 MG tablet Commonly known as: ADVIL Take 800 mg by mouth 3 (three) times daily as needed.   omeprazole 40 MG capsule Commonly known as: PRILOSEC Take 40 mg by mouth daily.   OZEMPIC (0.25 OR 0.5 MG/DOSE) Mikes Inject into the skin.   psyllium 0.52 g capsule Commonly known as: REGULOID Take 0.52 g by mouth daily.   rosuvastatin 5 MG tablet Commonly known as: CRESTOR Take 1 tablet (5 mg total) by mouth daily.   sildenafil 100 MG tablet Commonly known as: VIAGRA Take 1 tablet (100 mg total) by mouth daily as needed for erectile dysfunction.  triamcinolone cream 0.1 % Commonly known as: KENALOG 2 (two) times daily.        Allergies:  Allergies  Allergen Reactions   Mixed Ragweed     Watery eyes, sneezing    Family History  Problem Relation Age of Onset   Breast cancer Neg Hx    Prostate cancer Neg Hx    Colon cancer Neg Hx    Pancreatic cancer Neg Hx     Social History:  reports that he has never smoked. He has never used smokeless tobacco. He reports current alcohol use. He reports that he does not use drugs.  ROS: A complete review of systems was performed.  All systems are negative except for pertinent findings as noted.  Physical Exam:  Vital signs in last 24 hours: There were no vitals taken for this visit. Constitutional:  Alert and oriented, No acute distress Cardiovascular: Regular rate   Respiratory: Normal respiratory effor Genitourinary: Normal sphincter tone.  Prostate smooth, flat, no nodularity. Lymphatic: No lymphadenopathy Neurologic: Grossly intact, no focal deficits Psychiatric: Normal mood and affect  I have reviewed prior pt notes  I have reviewed urinalysis results--clear today  I have independently reviewed prior imaging--ultrasound volume  I have reviewed prior PSA and pathology results  I have reviewed IP SS form   Impression/Assessment:  1.  History of favorable intermediate risk prostate cancer, now 3 years out from brachytherapy with very slow decrease in PSA, most recently 1.5  2.  BPH with mild LUTS, on alfuzosin  3.  ED, on sildenafil  Plan:  1.  I am fine with starting him on daily tadalafil 5 mg a day with him eventually weaning off of the alfuzosin  2.  I will have him come back in 4 months to check on PSA/testosterone

## 2023-01-01 ENCOUNTER — Encounter: Payer: Self-pay | Admitting: Urology

## 2023-01-01 ENCOUNTER — Ambulatory Visit: Payer: BC Managed Care – PPO | Admitting: Urology

## 2023-01-01 VITALS — BP 121/74 | HR 68

## 2023-01-01 DIAGNOSIS — R7989 Other specified abnormal findings of blood chemistry: Secondary | ICD-10-CM

## 2023-01-01 DIAGNOSIS — N521 Erectile dysfunction due to diseases classified elsewhere: Secondary | ICD-10-CM

## 2023-01-01 DIAGNOSIS — Z8546 Personal history of malignant neoplasm of prostate: Secondary | ICD-10-CM | POA: Diagnosis not present

## 2023-01-01 DIAGNOSIS — N401 Enlarged prostate with lower urinary tract symptoms: Secondary | ICD-10-CM

## 2023-01-01 LAB — URINALYSIS, ROUTINE W REFLEX MICROSCOPIC
Bilirubin, UA: NEGATIVE
Ketones, UA: NEGATIVE
Leukocytes,UA: NEGATIVE
Nitrite, UA: NEGATIVE
Protein,UA: NEGATIVE
RBC, UA: NEGATIVE
Specific Gravity, UA: 1.025 (ref 1.005–1.030)
Urobilinogen, Ur: 0.2 mg/dL (ref 0.2–1.0)
pH, UA: 6 (ref 5.0–7.5)

## 2023-01-01 MED ORDER — TADALAFIL 5 MG PO TABS
5.0000 mg | ORAL_TABLET | Freq: Every day | ORAL | 3 refills | Status: DC | PRN
Start: 2023-01-01 — End: 2023-12-10

## 2023-01-28 ENCOUNTER — Telehealth (HOSPITAL_BASED_OUTPATIENT_CLINIC_OR_DEPARTMENT_OTHER): Payer: Self-pay | Admitting: Cardiology

## 2023-01-28 DIAGNOSIS — E7801 Familial hypercholesterolemia: Secondary | ICD-10-CM

## 2023-01-28 NOTE — Telephone Encounter (Signed)
Mychart message sent to patient asking if symptoms improved

## 2023-01-28 NOTE — Telephone Encounter (Signed)
  Per MyChart scheduling message:   After two weeks of taking Rosuvastatin, I had to discontinue use because of side effects.

## 2023-01-28 NOTE — Telephone Encounter (Signed)
Previously did not tolerate pravastatin. If myalgias improved off rosuvastatin, would not try alternate statin. Per Dr. Di Kindle note would recommend PCSK9i (could refer to pharmacy lipid clinic if he requests further information).   Alver Sorrow, NP

## 2023-03-04 ENCOUNTER — Other Ambulatory Visit (HOSPITAL_COMMUNITY): Payer: Self-pay

## 2023-03-04 ENCOUNTER — Telehealth: Payer: Self-pay | Admitting: Pharmacist Clinician (PhC)/ Clinical Pharmacy Specialist

## 2023-03-04 ENCOUNTER — Ambulatory Visit
Payer: BC Managed Care – PPO | Attending: Cardiovascular Disease | Admitting: Pharmacist Clinician (PhC)/ Clinical Pharmacy Specialist

## 2023-03-04 ENCOUNTER — Encounter: Payer: Self-pay | Admitting: Pharmacist Clinician (PhC)/ Clinical Pharmacy Specialist

## 2023-03-04 DIAGNOSIS — E7801 Familial hypercholesterolemia: Secondary | ICD-10-CM | POA: Diagnosis not present

## 2023-03-04 DIAGNOSIS — E785 Hyperlipidemia, unspecified: Secondary | ICD-10-CM | POA: Insufficient documentation

## 2023-03-04 NOTE — Assessment & Plan Note (Signed)
Assessment: Patient with familial hyperlipidemia not at LDL goal of < 70 Most recent LDL 195 on 08/2022 Not able to tolerate statins secondary to myalgias Reviewed options for lowering LDL cholesterol, including ezetimibe, PCSK-9 inhibitors, bempedoic acid and inclisiran.  Discussed mechanisms of action, dosing, side effects, potential decreases in LDL cholesterol and costs.  Also reviewed potential options for patient assistance.  Plan: Patient agreeable to starting Repatha 140 mg q14d Repeat labs after:  3 months Lipid Liver function Patient was given information manufacturer copay card - $5/month with [employer]

## 2023-03-04 NOTE — Progress Notes (Signed)
Office Visit    Patient Name: Marc Kim Date of Encounter: 03/04/2023  Primary Care Provider:  Ardyth Man, MD Primary Cardiologist:  Jodelle Red, MD  Chief Complaint    Hyperlipidemia - familial  Significant Past Medical History   Pre-DM 4/24 A1c 6.1, On semaglutide (ozemipc) and dapagliflozin  1c 6. Last episode a few months back  CKD 4/24 SCr 1.67, GFR 48, on dapagliflozin  gout Last flare about 2 months ago, 4/24 uric acid level at 9  (3-8.4)     Allergies  Allergen Reactions   Mixed Ragweed     Watery eyes, sneezing    History of Present Illness    Marc Kim is a 57 y.o. male patient of Dr Cristal Deer, in the office today to discuss options for cholesterol management.  He previously tried pravastatin, but developed myalgias, especially in his shoulders.  Most recently Dr. Cristal Deer prescribed rosuvastatin, however the same myalgias appeared within a month.  He is currently on semaglutide for preDM and weight management, and has lost about 15 pounds over the past year.  BMI currently at 33.    Insurance Carrier:  Pharmacologist - Chartered certified accountant:  [facility] [employer],  uses MyChart  LDL Cholesterol goal:  LDL< 70  Current Medications: none    Previously tried:  pravastatin, rosuvastatin - myalgias in shoulders  Family Hx:  father with bradycardia, PPM, died at 20, mother now 65, recent stroke, doing better, hyperlipidemia; younger brother preDM, son 52 , no known cholesterol issues  Social Hx: Tobacco: never Alcohol:  occasional    Diet:  oatmeal for breakfast, protein is mostly chicken and fish, avoids beef; vegetables usually frozen or canned  Exercise: no regular   Adherence Assessment  Do you ever forget to take your medication? [] Yes [x] No  Do you ever skip doses due to side effects? [] Yes [x] No  Do you have trouble affording your medicines? [] Yes [x] No  Are you ever unable to pick up your medication due to transportation  difficulties? [] Yes [x] No     Accessory Clinical Findings   08/2022 (Dr. Willaim Bane)  TC 270, TG 143, HDL 49, LDL 195  No results found for: "LIPOA"  Lab Results  Component Value Date   ALT 21 11/10/2019   AST 17 11/10/2019   ALKPHOS 83 11/10/2019   BILITOT 1.1 11/10/2019   Lab Results  Component Value Date   CREATININE 1.77 (H) 11/10/2019   BUN 20 11/10/2019   NA 140 11/10/2019   K 3.9 11/10/2019   CL 107 11/10/2019   CO2 23 11/10/2019   No results found for: "HGBA1C"  Home Medications    Current Outpatient Medications  Medication Sig Dispense Refill   Semaglutide, 2 MG/DOSE, (OZEMPIC, 2 MG/DOSE,) 8 MG/3ML SOPN Inject 2 mg into the skin every 7 (seven) days.     alfuzosin (UROXATRAL) 10 MG 24 hr tablet TAKE 1 TABLET (10 MG TOTAL) BY MOUTH IN THE MORNING AND AT BEDTIME 180 tablet 3   colchicine 0.6 MG tablet Take 0.6 mg by mouth as needed.      D3-50 1.25 MG (50000 UT) capsule Take 50,000 Units by mouth once a week.     FARXIGA 10 MG TABS tablet Take 10 mg by mouth daily.     fexofenadine (ALLEGRA) 180 MG tablet Take 180 mg by mouth every morning.      ibuprofen (ADVIL) 800 MG tablet Take 800 mg by mouth 3 (three) times daily as needed.     omeprazole (  PRILOSEC) 40 MG capsule Take 40 mg by mouth daily.     psyllium (REGULOID) 0.52 g capsule Take 0.52 g by mouth daily.      tadalafil (CIALIS) 5 MG tablet Take 1 tablet (5 mg total) by mouth daily as needed for erectile dysfunction. 90 tablet 3   triamcinolone cream (KENALOG) 0.1 % 2 (two) times daily.     No current facility-administered medications for this visit.     Assessment & Plan    Hyperlipidemia Assessment: Patient with familial hyperlipidemia not at LDL goal of < 70 Most recent LDL 195 on 08/2022 Not able to tolerate statins secondary to myalgias Reviewed options for lowering LDL cholesterol, including ezetimibe, PCSK-9 inhibitors, bempedoic acid and inclisiran.  Discussed mechanisms of action, dosing, side  effects, potential decreases in LDL cholesterol and costs.  Also reviewed potential options for patient assistance.  Plan: Patient agreeable to starting Repatha 140 mg q14d Repeat labs after:  3 months Lipid Liver function Patient was given information manufacturer copay card - $5/month with [employer]   Phillips Hay, PharmD CPP [facility] [address]  [place], [place] [address] [phone]  03/04/2023, 12:29 PM

## 2023-03-04 NOTE — Patient Instructions (Signed)
Your Results:             Your most recent labs Goal  Total Cholesterol 270 < 200  Triglycerides 143 < 150  HDL (happy/good cholesterol) 49 > 40  LDL (lousy/bad cholesterol 195 < 70   Medication changes:  We will start the process to get Repatha covered by your insurance.  Once the prior authorization is complete, I will call/send a MyChart message to let you know and confirm pharmacy information.   You will take one injection every 14 days  Lab orders:  We want to repeat labs after 2-3 months.  We will send you a lab order to remind you once we get closer to that time.    Go to [email or web address] to get a $5 copay card  [facility] [phone]   Thank you for choosing [facility]

## 2023-03-04 NOTE — Telephone Encounter (Signed)
Please do PA for Repatha

## 2023-03-06 MED ORDER — REPATHA SURECLICK 140 MG/ML ~~LOC~~ SOAJ: 140.0000 mg | SUBCUTANEOUS | 3 refills | Status: DC

## 2023-05-07 ENCOUNTER — Ambulatory Visit: Payer: BC Managed Care – PPO | Admitting: Urology

## 2023-06-03 ENCOUNTER — Ambulatory Visit (HOSPITAL_BASED_OUTPATIENT_CLINIC_OR_DEPARTMENT_OTHER): Payer: BC Managed Care – PPO | Admitting: Cardiology

## 2023-06-04 ENCOUNTER — Telehealth: Payer: Self-pay | Admitting: Pharmacist Clinician (PhC)/ Clinical Pharmacy Specialist

## 2023-06-04 DIAGNOSIS — E7801 Familial hypercholesterolemia: Secondary | ICD-10-CM

## 2023-06-04 NOTE — Telephone Encounter (Signed)
Msg sent to get lipid labs - Repatha

## 2023-06-10 ENCOUNTER — Ambulatory Visit (HOSPITAL_BASED_OUTPATIENT_CLINIC_OR_DEPARTMENT_OTHER): Payer: BC Managed Care – PPO | Admitting: Cardiology

## 2023-06-10 ENCOUNTER — Encounter (HOSPITAL_BASED_OUTPATIENT_CLINIC_OR_DEPARTMENT_OTHER): Payer: Self-pay | Admitting: Cardiology

## 2023-06-10 VITALS — BP 116/78 | HR 54 | Ht 75.0 in | Wt 265.0 lb

## 2023-06-10 DIAGNOSIS — Z79899 Other long term (current) drug therapy: Secondary | ICD-10-CM

## 2023-06-10 DIAGNOSIS — R7303 Prediabetes: Secondary | ICD-10-CM | POA: Diagnosis not present

## 2023-06-10 DIAGNOSIS — E7801 Familial hypercholesterolemia: Secondary | ICD-10-CM

## 2023-06-10 DIAGNOSIS — Z7189 Other specified counseling: Secondary | ICD-10-CM | POA: Diagnosis not present

## 2023-06-10 DIAGNOSIS — T466X5A Adverse effect of antihyperlipidemic and antiarteriosclerotic drugs, initial encounter: Secondary | ICD-10-CM

## 2023-06-10 DIAGNOSIS — G720 Drug-induced myopathy: Secondary | ICD-10-CM | POA: Diagnosis not present

## 2023-06-10 DIAGNOSIS — T466X5D Adverse effect of antihyperlipidemic and antiarteriosclerotic drugs, subsequent encounter: Secondary | ICD-10-CM

## 2023-06-10 NOTE — Progress Notes (Signed)
  Cardiology Office Note:  .    Date:  06/10/2023  ID:  Casper Harrison, DOB Dec 08, 1965, MRN [id number] PCP: Ardyth Man, MD  [facility] Providers Cardiologist:  Jodelle Red, MD     History of Present Illness: .    Lorrie Strauch is a 58 y.o. male with a hx of hyperlipidemia concerning for familial hypercholesterolemia, statin myopathy, prediabetes, prostate cancer, here for follow up.  Personal history of prostate cancer. He has been working with Dr. Retta Diones in urology.   Cardiovascular risk factors: Prior clinical ASCVD: None. Comorbid conditions:  Hyperlipidemia - Diagnosed about 10 years ago. Has tried medications before, including pravastatin 20 mg, but he still developed myalgias within 2 days particularly noting that he was unable to raise his right arm. Prediabetes - On farxiga and Ozempic. Metabolic syndrome/Obesity:  Highest adult weight 285 lbs. Current weight 265 lbs.   Today: Doing well overall. Had a small localized rash on his neck, getting better with cream. Stays very active with work. No limitations.  Reviewed medications today. Due for lipids today, he is fasting.  Denies chest pain, shortness of breath at rest or with normal exertion. No PND, orthopnea, LE edema or unexpected weight gain. No syncope or palpitations. ROS otherwise negative except as noted.   ROS:  Please see the history of present illness. ROS otherwise negative except as noted.   Studies Reviewed: Marland Kitchen    EKG Interpretation Date/Time:  Monday June 10 2023 08:25:14 EST Ventricular Rate:  54 PR Interval:  164 QRS Duration:  96 QT Interval:  392 QTC Calculation: 371 R Axis:   11  Text Interpretation: Sinus bradycardia Confirmed by Jodelle Red [phone]) on 06/10/2023 8:41:50 AM   Physical Exam:    VS:  BP 116/78 (BP Location: Left Arm, Patient Position: Sitting)   Pulse (!) 54   Ht 6\' 3"  (1.905 m)   Wt 265 lb (120.2 kg)   SpO2 99%   BMI 33.12 kg/m    Wt  Readings from Last 3 Encounters:  06/10/23 265 lb (120.2 kg)  12/03/22 265 lb (120.2 kg)  09/11/22 280 lb (127 kg)    GEN: Well nourished, well developed in no acute distress HEENT: Normal, moist mucous membranes NECK: No JVD CARDIAC: regular rhythm, normal S1 and S2, no rubs or gallops. No murmur. VASCULAR: Radial and DP pulses 2+ bilaterally. No carotid bruits RESPIRATORY:  Clear to auscultation without rales, wheezing or rhonchi  ABDOMEN: Soft, non-tender, non-distended MUSCULOSKELETAL:  Ambulates independently SKIN: Warm and dry, no edema NEUROLOGIC:  Alert and oriented x 3. No focal neuro deficits noted. PSYCHIATRIC:  Normal affect   ASSESSMENT AND PLAN: .    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  Prediabetes -on farxiga, 2 mg semaglutide I cannot see his labs from his PCP. Will check BMET today as Cr slightly up 6 mos ago.  CV risk counseling and prevention -recommend heart healthy/Mediterranean diet, with whole grains, fruits, vegetable, fish, lean meats, nuts, and olive oil. Limit salt. -recommend moderate walking, 3-5 times/week for 30-50 minutes each session. Aim for at least 150 minutes.week. Goal should be pace of 3 miles/hours, or walking 1.5 miles in 30 minutes -recommend avoidance of tobacco products. Avoid excess alcohol.  Dispo: Follow-up in 12 months, or sooner as needed.  Signed, Jodelle Red, MD

## 2023-06-10 NOTE — Patient Instructions (Signed)
Medication Instructions:  Your physician recommends that you continue on your current medications as directed. Please refer to the Current Medication list given to you today.  *If you need a refill on your cardiac medications before your next appointment, please call your pharmacy*   Lab Work: Your physician recommends that you return for lab work today: BMP and LIPIDS  If you have labs (blood work) drawn today and your tests are completely normal, you will receive your results only by: MyChart Message (if you have MyChart) OR A paper copy in the mail If you have any lab test that is abnormal or we need to change your treatment, we will call you to review the results.   Follow-Up: At [facility], you and your health needs are our priority.  As part of our continuing mission to provide you with exceptional heart care, we have created designated Provider Care Teams.  These Care Teams include your primary Cardiologist (physician) and Advanced Practice Providers (APPs -  Physician Assistants and Nurse Practitioners) who all work together to provide you with the care you need, when you need it.  We recommend signing up for the patient portal called "MyChart".  Sign up information is provided on this After Visit Summary.  MyChart is used to connect with patients for Virtual Visits (Telemedicine).  Patients are able to view lab/test results, encounter notes, upcoming appointments, etc.  Non-urgent messages can be sent to your provider as well.   To learn more about what you can do with MyChart, go to [email or web address].    Your next appointment:   12 month(s)  Provider:   Jodelle Red, MD

## 2023-06-11 LAB — BASIC METABOLIC PANEL
BUN/Creatinine Ratio: 12 (ref 9–20)
BUN: 20 mg/dL (ref 6–24)
CO2: 21 mmol/L (ref 20–29)
Calcium: 9.6 mg/dL (ref 8.7–10.2)
Chloride: 108 mmol/L — ABNORMAL HIGH (ref 96–106)
Creatinine, Ser: 1.64 mg/dL — ABNORMAL HIGH (ref 0.76–1.27)
Glucose: 89 mg/dL (ref 70–99)
Potassium: 4.7 mmol/L (ref 3.5–5.2)
Sodium: 144 mmol/L (ref 134–144)
eGFR: 48 mL/min/{1.73_m2} — ABNORMAL LOW (ref >59)

## 2023-06-11 LAB — LIPID PANEL
Chol/HDL Ratio: 2.6 {ratio} (ref 0.0–5.0)
Cholesterol, Total: 153 mg/dL (ref 100–199)
HDL: 58 mg/dL (ref >39)
LDL Chol Calc (NIH): 79 mg/dL (ref 0–99)
Triglycerides: 87 mg/dL (ref 0–149)
VLDL Cholesterol Cal: 16 mg/dL (ref 5–40)

## 2023-06-24 NOTE — Progress Notes (Signed)
History of Present Illness:   Rimas presents for followup of PCa--treated.   TRUS/Bx on 2.23.2021.  At that time, PSA was 7.9, prostate volume 43 mll,   PSAD 0.18.  4/12 cores positive for  adenocarcinoma: 3 cores revealed GS 3+3 pattern-left base lateral, left base medial and left mid medial with 90%, 20% and 3% of core involved with cancer. 1 core revealed GS 3+4 pattern,.1 was located in the left apex lateral zone, 60% of core involved with cancer. IPSS 5   7.8.2021: I-125 brachytherapy/SpaceOAR   12.19.2023: PSA up to 2.3 (previous level 6 mos ago 0.9). Testosterone level was 240.  Last year at this time it was 228.   His ED is well-managed by sildenafil 100 mg.  5.7.2024: PSA 1.6  8.27.2024: PSA 1.5, total/free T--213,1.9.    2.18.2025:  Past Medical History:  Diagnosis Date   Acid reflux    Arthritis    Diabetes mellitus without complication (HCC)    Gout    High cholesterol    Prostate cancer (HCC)    Seasonal allergies    Sleep apnea    Wears glasses     Past Surgical History:  Procedure Laterality Date   CARPAL TUNNEL RELEASE Left    COLONOSCOPY     HIATAL HERNIA REPAIR  2002?   PROSTATE BIOPSY     RADIOACTIVE SEED IMPLANT N/A 11/12/2019   Procedure: RADIOACTIVE SEED IMPLANT/BRACHYTHERAPY IMPLANT;  Surgeon: Marcine Matar, MD;  Location: [facility];  Service: Urology;  Laterality: N/A;  71 seeds   SPACE OAR INSTILLATION N/A 11/12/2019   Procedure: SPACE OAR INSTILLATION;  Surgeon: Marcine Matar, MD;  Location: [facility];  Service: Urology;  Laterality: N/A;    Home Medications:  Allergies as of 06/25/2023       Reactions   Mixed Ragweed    Watery eyes, sneezing        Medication List        Accurate as of June 24, 2023 12:08 PM. If you have any questions, ask your nurse or doctor.          alfuzosin 10 MG 24 hr tablet Commonly known as: UROXATRAL TAKE 1 TABLET (10 MG TOTAL) BY MOUTH IN THE MORNING  AND AT BEDTIME   colchicine 0.6 MG tablet Take 0.6 mg by mouth as needed.   D3-50 1.25 MG (50000 UT) capsule Generic drug: Cholecalciferol Take 50,000 Units by mouth once a week.   diclofenac 75 MG EC tablet Commonly known as: VOLTAREN Take 75 mg by mouth 2 (two) times daily as needed.   Farxiga 10 MG Tabs tablet Generic drug: dapagliflozin propanediol Take 10 mg by mouth daily.   fexofenadine 180 MG tablet Commonly known as: ALLEGRA Take 180 mg by mouth every morning.   ibuprofen 800 MG tablet Commonly known as: ADVIL Take 800 mg by mouth 3 (three) times daily as needed.   omeprazole 40 MG capsule Commonly known as: PRILOSEC Take 40 mg by mouth daily.   Ozempic (2 MG/DOSE) 8 MG/3ML Sopn Generic drug: Semaglutide (2 MG/DOSE) Inject 2 mg into the skin every 7 (seven) days.   psyllium 0.52 g capsule Commonly known as: REGULOID Take 0.52 g by mouth daily.   Repatha SureClick 140 MG/ML Soaj Generic drug: Evolocumab Inject 140 mg into the skin every 14 (fourteen) days.   tadalafil 5 MG tablet Commonly known as: CIALIS Take 1 tablet (5 mg total) by mouth daily as needed for erectile dysfunction.   triamcinolone  cream 0.1 % Commonly known as: KENALOG 2 (two) times daily.        Allergies:  Allergies  Allergen Reactions   Mixed Ragweed     Watery eyes, sneezing    Family History  Problem Relation Age of Onset   Breast cancer Neg Hx    Prostate cancer Neg Hx    Colon cancer Neg Hx    Pancreatic cancer Neg Hx     Social History:  reports that he has never smoked. He has never used smokeless tobacco. He reports current alcohol use. He reports that he does not use drugs.  ROS: A complete review of systems was performed.  All systems are negative except for pertinent findings as noted.  Physical Exam:  Vital signs in last 24 hours: There were no vitals taken for this visit. Constitutional:  Alert and oriented, No acute distress Cardiovascular: Regular  rate  Respiratory: Normal respiratory effor Genitourinary: Normal sphincter tone.  Prostate smooth, flat, no nodularity. Lymphatic: No lymphadenopathy Neurologic: Grossly intact, no focal deficits Psychiatric: Normal mood and affect  I have reviewed prior pt notes  I have reviewed urinalysis results  I have independently reviewed prior imaging--ultrasound volume  I have reviewed prior PSA and pathology results  I have reviewed IPSS form   Impression/Assessment:  1.  History of favorable intermediate risk prostate cancer,   2.  BPH with mild LUTS, on alfuzosin  3.  ED, on sildenafil  Plan:

## 2023-06-25 ENCOUNTER — Encounter: Payer: Self-pay | Admitting: Urology

## 2023-06-25 ENCOUNTER — Ambulatory Visit: Payer: BC Managed Care – PPO | Admitting: Urology

## 2023-06-25 VITALS — BP 115/69 | HR 76

## 2023-06-25 DIAGNOSIS — Z8546 Personal history of malignant neoplasm of prostate: Secondary | ICD-10-CM | POA: Diagnosis not present

## 2023-06-25 DIAGNOSIS — N401 Enlarged prostate with lower urinary tract symptoms: Secondary | ICD-10-CM | POA: Diagnosis not present

## 2023-06-25 DIAGNOSIS — N521 Erectile dysfunction due to diseases classified elsewhere: Secondary | ICD-10-CM | POA: Diagnosis not present

## 2023-06-25 LAB — URINALYSIS, ROUTINE W REFLEX MICROSCOPIC
Bilirubin, UA: NEGATIVE
Ketones, UA: NEGATIVE
Leukocytes,UA: NEGATIVE
Nitrite, UA: NEGATIVE
Protein,UA: NEGATIVE
RBC, UA: NEGATIVE
Specific Gravity, UA: 1.025 (ref 1.005–1.030)
Urobilinogen, Ur: 0.2 mg/dL (ref 0.2–1.0)
pH, UA: 6 (ref 5.0–7.5)

## 2023-06-26 LAB — PSA: Prostate Specific Ag, Serum: 0.7 ng/mL (ref 0.0–4.0)

## 2023-06-26 LAB — TESTOSTERONE: Testosterone: 240 ng/dL — ABNORMAL LOW (ref 264–916)

## 2023-06-28 ENCOUNTER — Encounter: Payer: Self-pay | Admitting: Urology

## 2023-07-03 ENCOUNTER — Telehealth: Payer: Self-pay

## 2023-07-03 NOTE — Telephone Encounter (Signed)
 Patient is made aware "Your PSA has gone down nicely! Testerone level still low @ 240. If you want to try a med to get it back up I'm fine with that. I would suggest a med called clomiphene citrate--used for infertility in women but also works well in men with low Testerone gets it up more naturally than injections. If you want me to send it in let me know--we can then have you come back in a couple of months to check T levels again."   Patient would like to try clomiphene citrate  to increase Testerone levels. Patient state's he want's to know when will you like for patient to come back for [facility] labs redrawn. Patient is aware a message will be sent to MD and someone will reach out with MD recommendation.

## 2023-07-09 ENCOUNTER — Other Ambulatory Visit: Payer: Self-pay | Admitting: Urology

## 2023-07-09 DIAGNOSIS — E291 Testicular hypofunction: Secondary | ICD-10-CM

## 2023-07-09 MED ORDER — CLOMIPHENE CITRATE 50 MG PO TABS: ORAL_TABLET | ORAL | 11 refills | Status: AC

## 2023-07-10 NOTE — Telephone Encounter (Signed)
 Patient was made aware and verbalized understanding. Patient want to know if there a more afford option than Clomid. Patient is aware a message will be sent to MD.

## 2023-07-10 NOTE — Telephone Encounter (Addendum)
 Tried calling patient with no answer, left vm for return call to office."I sent a prescription in for him to take, and I want to have him stop in in April for testosterone level.  I put orders in for that. Please call him and let him know that PSA is now down to 0.7, good news."

## 2023-07-18 ENCOUNTER — Other Ambulatory Visit: Payer: Self-pay | Admitting: Urology

## 2023-07-18 DIAGNOSIS — E291 Testicular hypofunction: Secondary | ICD-10-CM

## 2023-07-18 MED ORDER — ANASTROZOLE 1 MG PO TABS: ORAL_TABLET | ORAL | 11 refills | Status: AC

## 2023-07-18 NOTE — Telephone Encounter (Signed)
Open in error

## 2023-08-12 ENCOUNTER — Other Ambulatory Visit

## 2023-08-12 DIAGNOSIS — E291 Testicular hypofunction: Secondary | ICD-10-CM

## 2023-08-15 LAB — TESTOSTERONE,FREE AND TOTAL
Testosterone, Free: 9.2 pg/mL (ref 7.2–24.0)
Testosterone: 529 ng/dL (ref 264–916)

## 2023-08-20 ENCOUNTER — Encounter: Payer: Self-pay | Admitting: Urology

## 2023-08-22 ENCOUNTER — Encounter (HOSPITAL_BASED_OUTPATIENT_CLINIC_OR_DEPARTMENT_OTHER): Payer: Self-pay

## 2023-08-23 ENCOUNTER — Other Ambulatory Visit (HOSPITAL_COMMUNITY): Payer: Self-pay

## 2023-08-23 ENCOUNTER — Telehealth: Payer: Self-pay | Admitting: Pharmacy Technician

## 2023-08-23 NOTE — Telephone Encounter (Signed)
 Enrolled the patient in the Repatha  copay program and sent information to their pharmacy Repatha  copay Card:  RxBin: [id number]  RxPCN: [id number]  RxGroup: [id number]  ID: [id number]

## 2023-08-23 NOTE — Telephone Encounter (Signed)
 Hey team,   Would one you be able to tell me if this medication is needing prior auth?    Best, Guss Legacy, RN

## 2023-12-06 ENCOUNTER — Encounter (HOSPITAL_BASED_OUTPATIENT_CLINIC_OR_DEPARTMENT_OTHER): Payer: Self-pay

## 2023-12-09 ENCOUNTER — Other Ambulatory Visit: Payer: Self-pay | Admitting: Urology

## 2023-12-09 DIAGNOSIS — N521 Erectile dysfunction due to diseases classified elsewhere: Secondary | ICD-10-CM

## 2023-12-24 ENCOUNTER — Ambulatory Visit: Payer: BC Managed Care – PPO | Admitting: Urology

## 2024-01-02 ENCOUNTER — Ambulatory Visit: Admitting: Urology

## 2024-01-20 NOTE — Progress Notes (Signed)
 Impression/Assessment:  1.  History of favorable intermediate risk prostate cancer, PSA on somewhat of a downward trend  2.  BPH with mild LUTS, on alfuzosin  as well as daily tadalafil   3.  ED, still symptomatic on low-dose dail tadalafil   Plan:  1.  PSA is checked today  2.  We will switch him to a half of Clomid  tablet 2 nights a week to see if he can maintain regular testosterone  levels with that.  At the same time we will check his estrogen  3.  I will see back in about 6 months to recheck   History of Present Illness:   Marc Kim presents for followup of PCa--treated.   TRUS/Bx on 2.23.2021.  At that time, PSA was 7.9, prostate volume 43 mll,   PSAD 0.18.  4/12 cores positive for  adenocarcinoma: 3 cores revealed GS 3+3 pattern-left base lateral, left base medial and left mid medial with 90%, 20% and 3% of core involved with cancer. 1 core revealed GS 3+4 pattern,.1 was located in the left apex lateral zone, 60% of core involved with cancer. IPSS 5   7.8.2021: I-125 brachytherapy/SpaceOAR   12.19.2023: PSA up to 2.3 (previous level 6 mos ago 0.9). Testosterone  level was 240.  Last year at this time it was 228.   His ED is well-managed by sildenafil  100 mg.  5.7.2024: PSA 1.6  8.27.2024: PSA 1.5, total/free T--213,1.9.    2.18.2025: He is managed, for BPH, with alfuzosin  which he now takes once a day as well as daily Cialis .  Urinary symptoms are stable, tolerable.  He is still having problems with the ED on the low-dose Cialis  which he takes daily.  He has had no blood in his urine or stool. PSA 0.7.  9.16.2025: Occasional bothersome LUTS, but stable.  IPSS 6/2.  No blood in urine or stool.   Past Medical History:  Diagnosis Date   Acid reflux    Arthritis    Diabetes mellitus without complication (HCC)    Gout    High cholesterol    Prostate cancer (HCC)    Seasonal allergies    Sleep apnea    Wears glasses     Past Surgical History:  Procedure Laterality  Date   CARPAL TUNNEL RELEASE Left    COLONOSCOPY     HIATAL HERNIA REPAIR  2002?   PROSTATE BIOPSY     RADIOACTIVE SEED IMPLANT N/A 11/12/2019   Procedure: RADIOACTIVE SEED IMPLANT/BRACHYTHERAPY IMPLANT;  Surgeon: Matilda Senior, MD;  Location: [facility];  Service: Urology;  Laterality: N/A;  71 seeds   SPACE OAR INSTILLATION N/A 11/12/2019   Procedure: SPACE OAR INSTILLATION;  Surgeon: Matilda Senior, MD;  Location: [facility];  Service: Urology;  Laterality: N/A;    Home Medications:  Allergies as of 01/21/2024       Reactions   Mixed Ragweed    Watery eyes, sneezing        Medication List        Accurate as of January 20, 2024  1:07 PM. If you have any questions, ask your nurse or doctor.          alfuzosin  10 MG 24 hr tablet Commonly known as: UROXATRAL  TAKE 1 TABLET (10 MG TOTAL) BY MOUTH IN THE MORNING AND AT BEDTIME   anastrozole  1 MG tablet Commonly known as: ARIMIDEX  1 tablet po on Mon-Wed-Fri   clomiPHENE  50 MG tablet Commonly known as: CLOMID  1 tablet p.o. every Monday and Thursday  colchicine 0.6 MG tablet Take 0.6 mg by mouth as needed.   D3-50 1.25 MG (50000 UT) capsule Generic drug: Cholecalciferol Take 50,000 Units by mouth once a week.   diclofenac 75 MG EC tablet Commonly known as: VOLTAREN Take 75 mg by mouth 2 (two) times daily as needed.   Farxiga 10 MG Tabs tablet Generic drug: dapagliflozin propanediol Take 10 mg by mouth daily.   fexofenadine 180 MG tablet Commonly known as: ALLEGRA Take 180 mg by mouth every morning.   ibuprofen 800 MG tablet Commonly known as: ADVIL Take 800 mg by mouth 3 (three) times daily as needed.   omeprazole 40 MG capsule Commonly known as: PRILOSEC Take 40 mg by mouth daily.   Ozempic (2 MG/DOSE) 8 MG/3ML Sopn Generic drug: Semaglutide (2 MG/DOSE) Inject 2 mg into the skin every 7 (seven) days.   psyllium 0.52 g capsule Commonly known as: REGULOID Take  0.52 g by mouth daily.   Repatha  SureClick 140 MG/ML Soaj Generic drug: Evolocumab  Inject 140 mg into the skin every 14 (fourteen) days.   tadalafil  5 MG tablet Commonly known as: CIALIS  TAKE 1 TABLET BY MOUTH DAILY AS NEEDED FOR ERECTILE DYSFUNCTION   triamcinolone cream 0.1 % Commonly known as: KENALOG 2 (two) times daily.        Allergies:  Allergies  Allergen Reactions   Mixed Ragweed     Watery eyes, sneezing    Family History  Problem Relation Age of Onset   Breast cancer Neg Hx    Prostate cancer Neg Hx    Colon cancer Neg Hx    Pancreatic cancer Neg Hx     Social History:  reports that he has never smoked. He has never used smokeless tobacco. He reports current alcohol use. He reports that he does not use drugs.  ROS: A complete review of systems was performed.  All systems are negative except for pertinent findings as noted.  Physical Exam:  Vital signs in last 24 hours: There were no vitals taken for this visit. Constitutional:  Alert and oriented, No acute distress Cardiovascular: Regular rate  Respiratory: Normal respiratory effort Neurologic: Grossly intact, no focal deficits Psychiatric: Normal mood and affect  I have reviewed prior pt notes  I have reviewed urinalysis results--clear  I have independently reviewed prior imaging--ultrasound volume  I have reviewed prior PSA, pathology results  Most recent comprehensive metabolic panel reviewed  Prior testosterone  levels reviewed

## 2024-01-21 ENCOUNTER — Ambulatory Visit (INDEPENDENT_AMBULATORY_CARE_PROVIDER_SITE_OTHER): Admitting: Urology

## 2024-01-21 VITALS — BP 123/71 | HR 62

## 2024-01-21 DIAGNOSIS — N401 Enlarged prostate with lower urinary tract symptoms: Secondary | ICD-10-CM | POA: Diagnosis not present

## 2024-01-21 DIAGNOSIS — C61 Malignant neoplasm of prostate: Secondary | ICD-10-CM

## 2024-01-21 DIAGNOSIS — E291 Testicular hypofunction: Secondary | ICD-10-CM

## 2024-01-21 DIAGNOSIS — N529 Male erectile dysfunction, unspecified: Secondary | ICD-10-CM | POA: Diagnosis not present

## 2024-01-21 DIAGNOSIS — Z8546 Personal history of malignant neoplasm of prostate: Secondary | ICD-10-CM

## 2024-01-21 LAB — URINALYSIS, ROUTINE W REFLEX MICROSCOPIC
Bilirubin, UA: NEGATIVE
Ketones, UA: NEGATIVE
Leukocytes,UA: NEGATIVE
Nitrite, UA: NEGATIVE
Protein,UA: NEGATIVE
RBC, UA: NEGATIVE
Specific Gravity, UA: 1.025 (ref 1.005–1.030)
Urobilinogen, Ur: 0.2 mg/dL (ref 0.2–1.0)
pH, UA: 6 (ref 5.0–7.5)

## 2024-01-21 MED ORDER — CLOMIPHENE CITRATE 50 MG PO TABS: ORAL_TABLET | ORAL | 11 refills | Status: DC

## 2024-01-22 ENCOUNTER — Other Ambulatory Visit: Payer: Self-pay

## 2024-01-22 ENCOUNTER — Ambulatory Visit: Payer: Self-pay | Admitting: Urology

## 2024-01-22 DIAGNOSIS — E291 Testicular hypofunction: Secondary | ICD-10-CM

## 2024-01-22 LAB — PSA: Prostate Specific Ag, Serum: 1 ng/mL (ref 0.0–4.0)

## 2024-01-22 MED ORDER — CLOMIPHENE CITRATE 50 MG PO TABS: ORAL_TABLET | ORAL | 11 refills | Status: AC

## 2024-02-04 NOTE — Telephone Encounter (Signed)
 Reach out to pt to confirmed pharmacy he would like his medication. Pt state's [facility] is more affordable for his clomid . Tonna from [facility] call to get medication clarification. Confirmed with Dr. Matilda on clarification. Verbal from Dr. Matilda for patient take one tablet on Monday and take one on  Thursday. [facility] pharmacy tech will give pt a 10 pack because it comes in a quality of 10. Verbalized understanding

## 2024-02-05 ENCOUNTER — Other Ambulatory Visit: Payer: Self-pay | Admitting: Cardiology

## 2024-02-05 DIAGNOSIS — E78019 Familial hypercholesterolemia, unspecified: Secondary | ICD-10-CM

## 2024-08-17 ENCOUNTER — Other Ambulatory Visit

## 2024-08-18 ENCOUNTER — Ambulatory Visit: Admitting: Urology
